# Patient Record
Sex: Male | Born: 1955
Health system: Southern US, Community
[De-identification: ages and names within clinical notes are randomized; demographics above are authoritative.]

## PROBLEM LIST (undated history)

## (undated) DIAGNOSIS — E7800 Pure hypercholesterolemia, unspecified: Secondary | ICD-10-CM

## (undated) DIAGNOSIS — Q211 Atrial septal defect: Secondary | ICD-10-CM

## (undated) DIAGNOSIS — I712 Thoracic aortic aneurysm, without rupture, unspecified: Secondary | ICD-10-CM

## (undated) DIAGNOSIS — M797 Fibromyalgia: Secondary | ICD-10-CM

## (undated) DIAGNOSIS — I639 Cerebral infarction, unspecified: Secondary | ICD-10-CM

## (undated) DIAGNOSIS — F32A Depression, unspecified: Secondary | ICD-10-CM

## (undated) DIAGNOSIS — G44009 Cluster headache syndrome, unspecified, not intractable: Secondary | ICD-10-CM

## (undated) DIAGNOSIS — I10 Essential (primary) hypertension: Secondary | ICD-10-CM

## (undated) DIAGNOSIS — F329 Major depressive disorder, single episode, unspecified: Secondary | ICD-10-CM

## (undated) HISTORY — DX: Thoracic aortic aneurysm, without rupture, unspecified: I71.20

## (undated) HISTORY — DX: Thoracic aortic aneurysm, without rupture: I71.2

---

## 1988-06-29 HISTORY — PX: SHOULDER OPEN ROTATOR CUFF REPAIR: SHX2407

## 2011-06-30 HISTORY — PX: KNEE ARTHROSCOPY: SHX127

## 2015-06-08 ENCOUNTER — Encounter (HOSPITAL_COMMUNITY): Payer: Self-pay | Admitting: Emergency Medicine

## 2015-06-08 ENCOUNTER — Inpatient Hospital Stay (HOSPITAL_COMMUNITY): Payer: BC Managed Care – PPO

## 2015-06-08 ENCOUNTER — Inpatient Hospital Stay (HOSPITAL_COMMUNITY)
Admission: EM | Admit: 2015-06-08 | Discharge: 2015-06-10 | DRG: 065 | Disposition: A | Discharge status: Home / Self Care | Payer: BC Managed Care – PPO | Attending: Internal Medicine | Admitting: Internal Medicine

## 2015-06-08 ENCOUNTER — Emergency Department (HOSPITAL_COMMUNITY): Payer: BC Managed Care – PPO

## 2015-06-08 ENCOUNTER — Observation Stay (HOSPITAL_COMMUNITY): Payer: BC Managed Care – PPO

## 2015-06-08 DIAGNOSIS — G459 Transient cerebral ischemic attack, unspecified: Secondary | ICD-10-CM | POA: Diagnosis not present

## 2015-06-08 DIAGNOSIS — I6350 Cerebral infarction due to unspecified occlusion or stenosis of unspecified cerebral artery: Secondary | ICD-10-CM | POA: Diagnosis not present

## 2015-06-08 DIAGNOSIS — Z79899 Other long term (current) drug therapy: Secondary | ICD-10-CM

## 2015-06-08 DIAGNOSIS — I639 Cerebral infarction, unspecified: Secondary | ICD-10-CM | POA: Diagnosis present

## 2015-06-08 DIAGNOSIS — R278 Other lack of coordination: Secondary | ICD-10-CM | POA: Diagnosis present

## 2015-06-08 DIAGNOSIS — E785 Hyperlipidemia, unspecified: Secondary | ICD-10-CM | POA: Diagnosis present

## 2015-06-08 DIAGNOSIS — G9389 Other specified disorders of brain: Secondary | ICD-10-CM | POA: Diagnosis present

## 2015-06-08 DIAGNOSIS — R200 Anesthesia of skin: Secondary | ICD-10-CM | POA: Diagnosis present

## 2015-06-08 DIAGNOSIS — R4701 Aphasia: Secondary | ICD-10-CM | POA: Diagnosis present

## 2015-06-08 DIAGNOSIS — I10 Essential (primary) hypertension: Secondary | ICD-10-CM | POA: Diagnosis present

## 2015-06-08 DIAGNOSIS — Z7982 Long term (current) use of aspirin: Secondary | ICD-10-CM

## 2015-06-08 DIAGNOSIS — I63412 Cerebral infarction due to embolism of left middle cerebral artery: Principal | ICD-10-CM | POA: Diagnosis present

## 2015-06-08 DIAGNOSIS — Q211 Atrial septal defect: Secondary | ICD-10-CM

## 2015-06-08 DIAGNOSIS — Z8673 Personal history of transient ischemic attack (TIA), and cerebral infarction without residual deficits: Secondary | ICD-10-CM

## 2015-06-08 HISTORY — DX: Essential (primary) hypertension: I10

## 2015-06-08 HISTORY — DX: Cerebral infarction, unspecified: I63.9

## 2015-06-08 LAB — DIFFERENTIAL
Basophils Absolute: 0 10*3/uL (ref 0.0–0.1)
Basophils Relative: 1 %
Eosinophils Absolute: 0.1 10*3/uL (ref 0.0–0.7)
Eosinophils Relative: 2 %
Lymphocytes Relative: 22 %
Lymphs Abs: 1.2 10*3/uL (ref 0.7–4.0)
Monocytes Absolute: 0.7 10*3/uL (ref 0.1–1.0)
Monocytes Relative: 12 %
Neutro Abs: 3.5 10*3/uL (ref 1.7–7.7)
Neutrophils Relative %: 63 %

## 2015-06-08 LAB — COMPREHENSIVE METABOLIC PANEL
ALT: 42 U/L (ref 17–63)
AST: 56 U/L — ABNORMAL HIGH (ref 15–41)
Albumin: 3.8 g/dL (ref 3.5–5.0)
Alkaline Phosphatase: 34 U/L — ABNORMAL LOW (ref 38–126)
Anion gap: 8 (ref 5–15)
BUN: 14 mg/dL (ref 6–20)
CO2: 25 mmol/L (ref 22–32)
Calcium: 9.1 mg/dL (ref 8.9–10.3)
Chloride: 104 mmol/L (ref 101–111)
Creatinine, Ser: 1.03 mg/dL (ref 0.61–1.24)
GFR calc Af Amer: >60 mL/min (ref >60)
GFR calc non Af Amer: >60 mL/min (ref >60)
Glucose, Bld: 126 mg/dL — ABNORMAL HIGH (ref 65–99)
Potassium: 3.9 mmol/L (ref 3.5–5.1)
Sodium: 137 mmol/L (ref 135–145)
Total Bilirubin: 0.8 mg/dL (ref 0.3–1.2)
Total Protein: 6.5 g/dL (ref 6.5–8.1)

## 2015-06-08 LAB — CBC
HCT: 41.8 % (ref 39.0–52.0)
Hemoglobin: 14.1 g/dL (ref 13.0–17.0)
MCH: 28.5 pg (ref 26.0–34.0)
MCHC: 33.7 g/dL (ref 30.0–36.0)
MCV: 84.4 fL (ref 78.0–100.0)
Platelets: 166 10*3/uL (ref 150–400)
RBC: 4.95 MIL/uL (ref 4.22–5.81)
RDW: 13.5 % (ref 11.5–15.5)
WBC: 5.6 10*3/uL (ref 4.0–10.5)

## 2015-06-08 LAB — URINALYSIS, ROUTINE W REFLEX MICROSCOPIC
Bilirubin Urine: NEGATIVE
Glucose, UA: NEGATIVE mg/dL
Hgb urine dipstick: NEGATIVE
Ketones, ur: NEGATIVE mg/dL
Leukocytes, UA: NEGATIVE
Nitrite: NEGATIVE
Protein, ur: NEGATIVE mg/dL
Specific Gravity, Urine: 1.016 (ref 1.005–1.030)
pH: 6.5 (ref 5.0–8.0)

## 2015-06-08 LAB — PROTIME-INR
INR: 1.06 (ref 0.00–1.49)
Prothrombin Time: 14 seconds (ref 11.6–15.2)

## 2015-06-08 LAB — I-STAT CHEM 8, ED
BUN: 16 mg/dL (ref 6–20)
Calcium, Ion: 1.17 mmol/L (ref 1.13–1.30)
Chloride: 102 mmol/L (ref 101–111)
Creatinine, Ser: 0.8 mg/dL (ref 0.61–1.24)
Glucose, Bld: 119 mg/dL — ABNORMAL HIGH (ref 65–99)
HCT: 45 % (ref 39.0–52.0)
Hemoglobin: 15.3 g/dL (ref 13.0–17.0)
Potassium: 3.8 mmol/L (ref 3.5–5.1)
Sodium: 140 mmol/L (ref 135–145)
TCO2: 27 mmol/L (ref 0–100)

## 2015-06-08 LAB — ETHANOL: Alcohol, Ethyl (B): <5 mg/dL (ref <5)

## 2015-06-08 LAB — RAPID URINE DRUG SCREEN, HOSP PERFORMED
Amphetamines: NOT DETECTED
Barbiturates: NOT DETECTED
Benzodiazepines: NOT DETECTED
Cocaine: NOT DETECTED
Opiates: NOT DETECTED
Tetrahydrocannabinol: NOT DETECTED

## 2015-06-08 LAB — LIPID PANEL
Cholesterol: 138 mg/dL (ref 0–200)
HDL: 60 mg/dL (ref >40)
LDL Cholesterol: 68 mg/dL (ref 0–99)
Total CHOL/HDL Ratio: 2.3 RATIO
Triglycerides: 52 mg/dL (ref <150)
VLDL: 10 mg/dL (ref 0–40)

## 2015-06-08 LAB — I-STAT TROPONIN, ED: Troponin i, poc: 0 ng/mL (ref 0.00–0.08)

## 2015-06-08 LAB — APTT: aPTT: 28 seconds (ref 24–37)

## 2015-06-08 LAB — TROPONIN I: Troponin I: <0.03 ng/mL (ref <0.031)

## 2015-06-08 MED ORDER — STROKE: EARLY STAGES OF RECOVERY BOOK
Freq: Once | Status: AC
  Administered 2015-06-08: 18:00:00
  Filled 2015-06-08: qty 1

## 2015-06-08 MED ORDER — LORAZEPAM 2 MG/ML IJ SOLN
0.5000 mg | Freq: Once | INTRAMUSCULAR | Status: AC
  Administered 2015-06-08: 0.5 mg via INTRAVENOUS
  Filled 2015-06-08: qty 1

## 2015-06-08 MED ORDER — ASPIRIN 325 MG PO TABS
325.0000 mg | ORAL_TABLET | Freq: Every day | ORAL | Status: DC
  Administered 2015-06-09: 325 mg via ORAL
  Filled 2015-06-08 (×2): qty 1

## 2015-06-08 MED ORDER — PAROXETINE HCL 20 MG PO TABS
10.0000 mg | ORAL_TABLET | Freq: Every day | ORAL | Status: DC
  Administered 2015-06-09: 10 mg via ORAL
  Filled 2015-06-08 (×2): qty 1

## 2015-06-08 MED ORDER — DICLOFENAC SODIUM 75 MG PO TBEC
75.0000 mg | DELAYED_RELEASE_TABLET | Freq: Two times a day (BID) | ORAL | Status: DC
  Filled 2015-06-08: qty 1

## 2015-06-08 MED ORDER — ATORVASTATIN CALCIUM 10 MG PO TABS
20.0000 mg | ORAL_TABLET | Freq: Every day | ORAL | Status: DC
  Administered 2015-06-09: 20 mg via ORAL
  Filled 2015-06-08 (×2): qty 2

## 2015-06-08 MED ORDER — SENNOSIDES-DOCUSATE SODIUM 8.6-50 MG PO TABS: 1.0000 | ORAL_TABLET | Freq: Every evening | ORAL | Status: DC | PRN

## 2015-06-08 MED ORDER — ENOXAPARIN SODIUM 30 MG/0.3ML ~~LOC~~ SOLN
30.0000 mg | SUBCUTANEOUS | Status: DC
  Administered 2015-06-08 – 2015-06-09 (×2): 30 mg via SUBCUTANEOUS
  Filled 2015-06-08 (×2): qty 0.3

## 2015-06-08 MED ORDER — SODIUM CHLORIDE 0.9 % IV SOLN
INTRAVENOUS | Status: DC
  Administered 2015-06-08: 1000 mL via INTRAVENOUS
  Administered 2015-06-09: 06:00:00 via INTRAVENOUS

## 2015-06-08 NOTE — ED Provider Notes (Signed)
CSN: [id number]     Arrival date & time 06/08/15  1150 History   First MD Initiated Contact with Patient 06/08/15 1158     Chief Complaint  Patient presents with  . Aphasia  . Numbness    An emergency department physician performed an initial assessment on this suspected stroke patient at 1152. (Consider location/radiation/quality/duration/timing/severity/associated sxs/prior Treatment) HPI There is a 59 year old man who presents today with complaint of left upper extremity numbness and difficulty speaking. He states this occurred about 11 PM. Symptoms have improved. He was alone but states that he was normal prior to this happening. He describes having some difficulty using his left hand at the time. He has had a previous stroke but was in a different system at that time. He describes having complete resolution of his symptoms since then. He has been taking his medications as advised. He denies any head injury or headache. He denies any chest pain, cough, abdominal pain, vomiting, or diarrhea. Past Medical History  Diagnosis Date  . Stroke [facility](HCC)     Bleed in June 2016  . Hypertension    Past Surgical History  Procedure Laterality Date  . Shoulder surgery Right   . Knee surgery Right    History reviewed. No pertinent family history. Social History  Substance Use Topics  . Smoking status: Never Smoker   . Smokeless tobacco: None  . Alcohol Use: 1.2 oz/week    2 Glasses of wine per week    Review of Systems  All other systems reviewed and are negative.     Allergies  Review of patient's allergies indicates no known allergies.  Home Medications   Prior to Admission medications   Not on File   BP 135/91 mmHg  Pulse 62  Temp(Src) 98.1 F (36.7 C)  Resp 12  Ht 6' (1.829 m)  Wt 90.719 kg  BMI 27.12 kg/m2  SpO2 98% Physical Exam  Constitutional: He is oriented to person, place, and time. He appears well-developed and well-nourished.  HENT:  Head: Normocephalic and  atraumatic.  Right Ear: Tympanic membrane and external ear normal.  Left Ear: Tympanic membrane and external ear normal.  Nose: Nose normal. Right sinus exhibits no maxillary sinus tenderness and no frontal sinus tenderness. Left sinus exhibits no maxillary sinus tenderness and no frontal sinus tenderness.  Mouth/Throat: Oropharynx is clear and moist.  Eyes: Conjunctivae and EOM are normal. Pupils are equal, round, and reactive to light. Right eye exhibits no nystagmus. Left eye exhibits no nystagmus.  Neck: Normal range of motion. Neck supple.  Cardiovascular: Normal rate, regular rhythm, normal heart sounds and intact distal pulses.   Pulmonary/Chest: Effort normal and breath sounds normal. No respiratory distress. He exhibits no tenderness.  Abdominal: Soft. Bowel sounds are normal. He exhibits no distension and no mass. There is no tenderness.  Musculoskeletal: Normal range of motion. He exhibits no edema or tenderness.  Neurological: He is alert and oriented to person, place, and time. He has normal strength and normal reflexes. No sensory deficit. He displays a negative Romberg sign. GCS eye subscore is 4. GCS verbal subscore is 5. GCS motor subscore is 6.  Reflex Scores:      Tricep reflexes are 2+ on the right side and 2+ on the left side.      Bicep reflexes are 2+ on the right side and 2+ on the left side.      Brachioradialis reflexes are 2+ on the right side and 2+ on the left side.  Patellar reflexes are 2+ on the right side and 2+ on the left side.      Achilles reflexes are 2+ on the right side and 2+ on the left side. Speech is normal without dysarthria, dysphasia, or aphasia. Muscle strength is 5/5 in bilateral shoulders, elbow flexor and extensors, wrist flexor and extensors, and intrinsic hand muscles. 5/5 bilateral lower extremity hip flexors, extensors, knee flexors and extensors, and ankle dorsi and plantar flexors.    Skin: Skin is warm and dry. No rash noted.   Psychiatric: He has a normal mood and affect. His behavior is normal. Judgment and thought content normal.  Nursing note and vitals reviewed.   ED Course  Procedures (including critical care time) Labs Review Labs Reviewed  I-STAT CHEM 8, ED - Abnormal; Notable for the following:    Glucose, Bld 119 (*)    All other components within normal limits  PROTIME-INR  APTT  CBC  DIFFERENTIAL  ETHANOL  COMPREHENSIVE METABOLIC PANEL  URINE RAPID DRUG SCREEN, HOSP PERFORMED  URINALYSIS, ROUTINE W REFLEX MICROSCOPIC (NOT AT [facility])  I-STAT TROPOININ, ED    Imaging Review Ct Head Wo Contrast  06/08/2015  CLINICAL DATA:  59 year old presenting with acute onset of left-sided weakness approximately 1 hour 15 min prior to imaging. Personal history of stroke in June, 2016. EXAM: CT HEAD WITHOUT CONTRAST TECHNIQUE: Contiguous axial images were obtained from the base of the skull through the vertex without intravenous contrast. COMPARISON:  No prior imaging is available for comparison. FINDINGS: Ventricular system normal in size and appearance for age. Encephalomalacia involving the white matter at the corticomedullary junction of the left frontal lobe (images 22, 23). No mass lesion. No midline shift. No acute hemorrhage or hematoma. No extra-axial fluid collections. No evidence of acute infarction. No skull fracture or other focal osseous abnormality involving the skull. Mild mucosal thickening involving anterior ethmoid air cells bilaterally and the base of the right maxillary sinus. Small mucous retention cyst or polyp in the right sphenoid sinus. Bilateral mastoid air cells and middle ear cavities well aerated. IMPRESSION: 1. No acute intracranial abnormality. 2. Focal encephalomalacia involving the white matter at the corticomedullary junction of the left frontal lobe, either the sequela of prior intracranial hemorrhage or an old white matter stroke. I telephoned these Code Stroke results at the time of  interpretation on 06/08/2015 at 12:22 pm to Dr. Amada Jupiter of the neuro hospitalist service, who verbally acknowledged these results. Electronically Signed   By: Hulan Saas M.D.   On: 06/08/2015 12:26   I have personally reviewed and evaluated these images and lab results as part of my medical decision-making.   EKG Interpretation   Date/Time:  Saturday June 08 2015 12:00:59 EST Ventricular Rate:  67 PR Interval:  218 QRS Duration: 91 QT Interval:  376 QTC Calculation: 397 R Axis:   68 Text Interpretation:  Normal sinus rhythm Premature ventricular complexes  Confirmed by Nyashia Raney MD, Duwayne Heck ([id number]) on 06/08/2015 12:05:29 PM      MDM   Final diagnoses:  Transient cerebral ischemia, unspecified transient cerebral ischemia type   59 year old male with history of stroke presents today with left-sided hand weakness and difficulty speaking. Symptoms have resolved here in the emergency department. Head CT reveals no evidence of acute abnormality. Dr. Amada Jupiter is on call for stroke and also saw and evaluated the patient. The plan is to have the patient admitted and evaluated for TIA.   Margarita Grizzle, MD 06/08/15 [phone]

## 2015-06-08 NOTE — Progress Notes (Signed)
Patient arrived from ED around 1630 alert and oriented NIH of 0 no pain will continue to monitor.

## 2015-06-08 NOTE — ED Notes (Signed)
Patient transported to X-ray 

## 2015-06-08 NOTE — ED Notes (Signed)
Patient transported to CT 

## 2015-06-08 NOTE — H&P (Signed)
[facility] History and Physical  Troy [name] ZOX:[id number] DOB: [birth year] DOA: 06/08/2015  Referring physician: ray PCP: No PCP Per Patient   Chief Complaint: left hand/arm numbness/aphasia  HPI: Troy [name] is a 59 y.o. male with a past medical history that includes stroke in June 2016, attention presents to the emergency department with the chief complaint of expressive aphasia and left arm weakness and numbness. He presented to the emergency department code stroke was called and he was evaluated by Dr. Amada Jupiter with neurology who opined TIA not TPA candidate due to resolution of symptoms were recommended admission for TIA workup.  She reports getting up this morning in the process of brushes teeth and come in his hair he developed left arm and left hand numbness. So sedated symptoms include word searching and expressive aphasia while trying to call EMS. He reports taking 325 mg of aspirin and waiting for the ambulance. He reports symptoms resolved while in the ambulance ride to the hospital. He denies any chest pain palpitations headache dizziness visual disturbances syncope or near-syncope. He denies any abdominal pain nausea vomiting diarrhea. He denies any recent illness fever chills or decreased oral intake.  The emergency department he is afebrile hemodynamically stable and not hypoxic.    Review of Systems:  10 point review of systems complete and all systems are negative except as indicated in the history of present illness  Past Medical History  Diagnosis Date  . Stroke [facility])     Bleed in June 2016  . Hypertension   . TIA (transient ischemic attack)    Past Surgical History  Procedure Laterality Date  . Shoulder surgery Right   . Knee surgery Right    Social History:  reports that he has never smoked. He does not have any smokeless tobacco history on file. He reports that he drinks about 1.2 oz of alcohol per week. He reports that he does not use illicit  drugs. Is married he lives at home with his wife and children who reside in [place] as she is a Runner, broadcasting/film/video up there and he is a Paramedic at [employer]. He is independent with ADLs No Known Allergies  History reviewed. No pertinent family history. he has 3 siblings their collective medical history is positive for hypertension  Prior to Admission medications   Medication Sig Start Date End Date Taking? Authorizing Provider  aspirin 325 MG tablet Take 325 mg by mouth daily.   Yes Historical Provider, MD  atorvastatin (LIPITOR) 20 MG tablet Take 1 tablet by mouth daily. 04/04/15  Yes Historical Provider, MD  diclofenac (VOLTAREN) 75 MG EC tablet Take 1 tablet by mouth 2 (two) times daily. 05/08/15  Yes Historical Provider, MD  PARoxetine (PAXIL) 10 MG tablet Take 1 tablet by mouth daily. 04/04/15  Yes Historical Provider, MD  triamterene-hydrochlorothiazide (MAXZIDE-25) 37.5-25 MG tablet Take 1 tablet by mouth daily. 04/15/15  Yes Historical Provider, MD   Physical Exam: Filed Vitals:   06/08/15 1330 06/08/15 1345 06/08/15 1400 06/08/15 1415  BP: 131/79 115/104 134/98 145/96  Pulse: 56 62 60 86  Temp:      TempSrc:      Resp: Height:      Weight:      SpO2: 95% 96% 94% 95%    Wt Readings from Last 3 Encounters:  06/08/15 90.719 kg (200 lb)    General:  Appears calm and comfortable Eyes: PERRL, normal lids, irises & conjunctiva ENT: grossly normal hearing,  lips & tongue Neck: no LAD, masses or thyromegaly Cardiovascular: RRR, no m/r/g. No LE edema. Telemetry: SR, no arrhythmias  Respiratory: CTA bilaterally, no w/r/r. Normal respiratory effort. Abdomen: soft, ntnd other bowel sounds throughout no guarding or rebounding Skin: no rash or induration seen on limited exam Musculoskeletal: grossly normal tone BUE/BLE Psychiatric: grossly normal mood and affect, speech fluent and appropriate Neurologic: grossly non-focal. Speech clear facial symmetry tongue midline no  pronator drift           Labs on Admission:  Basic Metabolic Panel:  Recent Labs Lab 06/08/15 1200 06/08/15 1208  NA 137 140  K 3.9 3.8  CL 104 102  CO2 25  --   GLUCOSE 126* 119*  BUN 14 16  CREATININE 1.03 0.80  CALCIUM 9.1  --    Liver Function Tests:  Recent Labs Lab 06/08/15 1200  AST 56*  ALT 42  ALKPHOS 34*  BILITOT 0.8  PROT 6.5  ALBUMIN 3.8   No results for input(s): LIPASE, AMYLASE in the last 168 hours. No results for input(s): AMMONIA in the last 168 hours. CBC:  Recent Labs Lab 06/08/15 1200 06/08/15 1208  WBC 5.6  --   NEUTROABS 3.5  --   HGB 14.1 15.3  HCT 41.8 45.0  MCV 84.4  --   PLT 166  --    Cardiac Enzymes: No results for input(s): CKTOTAL, CKMB, CKMBINDEX, TROPONINI in the last 168 hours.  BNP (last 3 results) No results for input(s): BNP in the last 8760 hours.  ProBNP (last 3 results) No results for input(s): PROBNP in the last 8760 hours.  CBG: No results for input(s): GLUCAP in the last 168 hours.  Radiological Exams on Admission: Ct Head Wo Contrast  06/08/2015  CLINICAL DATA:  59 year old presenting with acute onset of left-sided weakness approximately 1 hour 15 min prior to imaging. Personal history of stroke in June, 2016. EXAM: CT HEAD WITHOUT CONTRAST TECHNIQUE: Contiguous axial images were obtained from the base of the skull through the vertex without intravenous contrast. COMPARISON:  No prior imaging is available for comparison. FINDINGS: Ventricular system normal in size and appearance for age. Encephalomalacia involving the white matter at the corticomedullary junction of the left frontal lobe (images 22, 23). No mass lesion. No midline shift. No acute hemorrhage or hematoma. No extra-axial fluid collections. No evidence of acute infarction. No skull fracture or other focal osseous abnormality involving the skull. Mild mucosal thickening involving anterior ethmoid air cells bilaterally and the base of the right  maxillary sinus. Small mucous retention cyst or polyp in the right sphenoid sinus. Bilateral mastoid air cells and middle ear cavities well aerated. IMPRESSION: 1. No acute intracranial abnormality. 2. Focal encephalomalacia involving the white matter at the corticomedullary junction of the left frontal lobe, either the sequela of prior intracranial hemorrhage or an old white matter stroke. I telephoned these Code Stroke results at the time of interpretation on 06/08/2015 at 12:22 pm to Dr. Amada Jupiter of the neuro hospitalist service, who verbally acknowledged these results. Electronically Signed   By: Hulan Saas M.D.   On: 06/08/2015 12:26    EKG: Independently reviewed. Will sinus rhythm with PVCs  Assessment/Plan Active Problems:   TIA (transient ischemic attack)   Hypertension   Stroke (cerebrum) (HCC)   Aphasia   Left arm numbness  #1. Left arm numbness/aphasia/TIA. CT of head with focal encephalomalacia involving the white matter at the corticomedullary junction of the left frontal lobe, either the sequela of  prior intracranial hemorrhage or an old white matter stroke. Patient with history of stroke in June 2016 in [place] awaiting records -Symptoms resolved in route to hospital -Will admit for TIA workup -Will obtain MRI/MRA carotid Dopplers 2-D echo -We'll keep him nothing by mouth until he passes bedside swallow eval and then will give heart healthy diet -Continue his home aspirin and statin -Obtain a lipid panel hemoglobin A1c -He was evaluated by Dr. Amada JupiterKirkpatrick was neuro as code stroke was called and then canceled. Neurology opined non-TPA candidate as symptoms resolve -Physical therapy -await neuro recommendations   #2. Hypertension. controlled in the emergency department -Home medications include Maxzide 25/37.5 I will hold this for now to allow for permissive hypertension -Pressure in the emergency department 145/96 -Monitor closely    Code Status: full DVT  Prophylaxis: Family Communication: none present Disposition Plan: home hopefully tomorrow  Time spent: 50  minutes  [facility] HospitalBLACK,Harjit Douds M [facility]

## 2015-06-08 NOTE — ED Notes (Signed)
Reports at 1100 today while brushing teeth left arm and hand felt numb; has history of stroke, so called EMS. He felt he had trouble getting words out while making that call. He took 325mg  ASA at home. All resolved in route except he states his left hand still feels numb. Dr. Rosalia Hammersay at bedside to eval.

## 2015-06-08 NOTE — ED Notes (Signed)
Dr. Rosalia Hammersay at bedside evaluating for code stroke.

## 2015-06-08 NOTE — ED Notes (Signed)
Patient returned from MRI.

## 2015-06-08 NOTE — ED Notes (Signed)
Attempted report 

## 2015-06-08 NOTE — ED Notes (Signed)
Patient transported to MRI 

## 2015-06-08 NOTE — Consult Note (Signed)
Neurology Consultation Reason for Consult: Left-sided numbness Referring Physician: Rosalia Hammersay, D  CC: Left-sided numbness  History is obtained from: Patient  HPI: Janice CoffinRobert Martelle is a 59 y.o. male with a history of previous stroke (felt to be ischemic with hemorrhagic conversion) in 2010 who presents with sudden onset left-sided numbness started earlier today. He states that his arms completely resolved with the exception of mild difficulty with moving his left thumb.  LKW: 11 AM tpa given?: no, rapidly improving mild symptoms    ROS: A 14 point ROS was performed and is negative except as noted in the HPI.   Past Medical History  Diagnosis Date  . Stroke [facility](HCC)     Bleed in June 2016  . Hypertension   . TIA (transient ischemic attack)      History reviewed. No pertinent family history.   Social History:  reports that he has never smoked. He does not have any smokeless tobacco history on file. He reports that he drinks about 1.2 oz of alcohol per week. He reports that he does not use illicit drugs.   Exam: Current vital signs: BP 128/75 mmHg  Pulse 62  Temp(Src) 98.1 F (36.7 C)  Resp 20  Ht 6' (1.829 m)  Wt 90.719 kg (200 lb)  BMI 27.12 kg/m2  SpO2 96% Vital signs in last 24 hours: Temp:  [98.1 F (36.7 C)] 98.1 F (36.7 C) (12/10 1215) Pulse Rate:  [56-86] 62 (12/10 1548) Resp:  [12-20] 20 (12/10 1548) BP: (115-157)/(75-104) 128/75 mmHg (12/10 1548) SpO2:  [94 %-98 %] 96 % (12/10 1548) Weight:  [90.719 kg (200 lb)] 90.719 kg (200 lb) (12/10 1214)   Physical Exam  Constitutional: Appears well-developed and well-nourished.  Psych: Affect appropriate to situation Eyes: No scleral injection HENT: No OP obstrucion Head: Normocephalic.  Cardiovascular: Normal rate and regular rhythm.  Respiratory: Effort normal and breath sounds normal to anterior ascultation GI: Soft.  No distension. There is no tenderness.  Skin: WDI  Neuro: Mental Status: Patient is awake,  alert, oriented to person, place, month, year, and situation. Patient is able to give a clear and coherent history. No signs of aphasia or neglect Cranial Nerves: II: Visual Fields are full. Pupils are equal, round, and reactive to light.   III,IV, VI: EOMI without ptosis or diploplia.  V: Facial sensation is symmetric to temperature VII: Facial movement is symmetric.  VIII: hearing is intact to voice X: Uvula elevates symmetrically XI: Shoulder shrug is symmetric. XII: tongue is midline without atrophy or fasciculations.  Motor: Tone is normal. Bulk is normal. 5/5 strength was present in all four extremities.  Sensory: Sensation is symmetric to light touch and temperature in the arms and legs. Deep Tendon Reflexes: 2+ and symmetric in the biceps and patellae.  Plantars: Toes are downgoing bilaterally.  Cerebellar: FNF and HKS are intact bilaterally     I have reviewed labs in epic and the results pertinent to this consultation are: CMP-unremarkable  I have reviewed the images obtained: MRI brain- scattered infarcts in the right MCA territory  Impression: 59 year old male with what I suspect was likely an embolus which broke up. He will need further evaluation for secondary stroke prevention.  Recommendations: 1. HgbA1c, fasting lipid panel 2. Frequent neuro checks 3. Echocardiogram 4. Carotid dopplers 5. Prophylactic therapy-Antiplatelet med: Aspirin - dose 325mg  PO or 300mg  PR 6. Risk factor modification 7. Telemetry monitoring 8. PT consult, OT consult, Speech consult   Ritta SlotMcNeill Zakhai Meisinger, MD [facility] [phone]  If  7pm- 7am, please page neurology on call as listed in [place].

## 2015-06-08 NOTE — ED Notes (Signed)
Dr. Amada JupiterKirkpatrick at bedside doing evaluation.

## 2015-06-08 NOTE — ED Notes (Signed)
Patient returned from CT

## 2015-06-09 ENCOUNTER — Observation Stay (HOSPITAL_COMMUNITY): Payer: BC Managed Care – PPO

## 2015-06-09 DIAGNOSIS — I639 Cerebral infarction, unspecified: Secondary | ICD-10-CM | POA: Diagnosis not present

## 2015-06-09 DIAGNOSIS — R278 Other lack of coordination: Secondary | ICD-10-CM | POA: Diagnosis present

## 2015-06-09 DIAGNOSIS — R4701 Aphasia: Secondary | ICD-10-CM | POA: Diagnosis present

## 2015-06-09 DIAGNOSIS — I63411 Cerebral infarction due to embolism of right middle cerebral artery: Secondary | ICD-10-CM

## 2015-06-09 DIAGNOSIS — G459 Transient cerebral ischemic attack, unspecified: Secondary | ICD-10-CM

## 2015-06-09 DIAGNOSIS — Z8673 Personal history of transient ischemic attack (TIA), and cerebral infarction without residual deficits: Secondary | ICD-10-CM | POA: Diagnosis not present

## 2015-06-09 DIAGNOSIS — I638 Other cerebral infarction: Secondary | ICD-10-CM

## 2015-06-09 DIAGNOSIS — E785 Hyperlipidemia, unspecified: Secondary | ICD-10-CM | POA: Diagnosis present

## 2015-06-09 DIAGNOSIS — Q211 Atrial septal defect: Secondary | ICD-10-CM | POA: Diagnosis not present

## 2015-06-09 DIAGNOSIS — G9389 Other specified disorders of brain: Secondary | ICD-10-CM | POA: Diagnosis present

## 2015-06-09 DIAGNOSIS — R208 Other disturbances of skin sensation: Secondary | ICD-10-CM | POA: Diagnosis not present

## 2015-06-09 DIAGNOSIS — R200 Anesthesia of skin: Secondary | ICD-10-CM | POA: Diagnosis present

## 2015-06-09 DIAGNOSIS — I10 Essential (primary) hypertension: Secondary | ICD-10-CM

## 2015-06-09 DIAGNOSIS — I63412 Cerebral infarction due to embolism of left middle cerebral artery: Secondary | ICD-10-CM | POA: Diagnosis present

## 2015-06-09 DIAGNOSIS — Z7982 Long term (current) use of aspirin: Secondary | ICD-10-CM | POA: Diagnosis not present

## 2015-06-09 DIAGNOSIS — Z79899 Other long term (current) drug therapy: Secondary | ICD-10-CM | POA: Diagnosis not present

## 2015-06-09 LAB — BASIC METABOLIC PANEL
Anion gap: 5 (ref 5–15)
BUN: 10 mg/dL (ref 6–20)
CO2: 29 mmol/L (ref 22–32)
Calcium: 8.8 mg/dL — ABNORMAL LOW (ref 8.9–10.3)
Chloride: 106 mmol/L (ref 101–111)
Creatinine, Ser: 0.99 mg/dL (ref 0.61–1.24)
GFR calc Af Amer: >60 mL/min (ref >60)
GFR calc non Af Amer: >60 mL/min (ref >60)
Glucose, Bld: 100 mg/dL — ABNORMAL HIGH (ref 65–99)
Potassium: 4.2 mmol/L (ref 3.5–5.1)
Sodium: 140 mmol/L (ref 135–145)

## 2015-06-09 LAB — TROPONIN I: Troponin I: <0.03 ng/mL (ref <0.031)

## 2015-06-09 NOTE — Progress Notes (Signed)
[facility] that [facility] Department is not opened till Monday. Will pass on to oncoming nurse to follow-up.

## 2015-06-09 NOTE — Progress Notes (Signed)
  Echocardiogram 2D Echocardiogram has been performed.  Troy SavoyCasey N Sterling [name] 06/09/2015, 3:46 PM

## 2015-06-09 NOTE — Progress Notes (Signed)
OT Cancellation Note  Patient Details Name: Janice CoffinRobert Radermacher MRN: [id number] DOB: 1955-11-09   Cancelled Treatment:    Reason Eval/Treat Not Completed: OT screened, no needs identified, will sign off. Pt reports he feels he is back to his baseline. He states he has no concerns with bathing, dressing, etc. He also reports his L hand/thumb function has returned to baseline. Will sign off.  Lennox LaityStone, Kenyata Napier Stafford  [phone] 06/09/2015, 1:03 PM

## 2015-06-09 NOTE — Progress Notes (Signed)
I picked patient up from out going nurse around 1530 he is oriented, alert, no pain NIH is 0 and he is signed off independent, will continue to monitor.

## 2015-06-09 NOTE — Evaluation (Signed)
Physical Therapy Evaluation Patient Details Name: Troy CoffinRobert Felkins MRN: [id number] DOB: [birth year] Today's Date: 06/09/2015   History of Present Illness  Troy CoffinRobert Mckethan is a 59 y.o. male with a history of previous stroke (felt to be ischemic with hemorrhagic conversion) in 2010 who presents with sudden onset left-sided numbness started earlier today. He states that his arms completely resolved with the exception of mild difficulty with moving his left thumb.  Clinical Impression  Patient evaluated by Physical Therapy with no further acute PT needs identified. All education has been completed and the patient has no further questions. Pt's symptoms appear to have completely resolved, no strength or balance deficits noted on eval, gait pattern WFL.  See below for any follow-up Physical Therapy or equipment needs. PT is signing off. Thank you for this referral.     Follow Up Recommendations No PT follow up    Equipment Recommendations  None recommended by PT    Recommendations for Other Services       Precautions / Restrictions Precautions Precautions: None Restrictions Weight Bearing Restrictions: No      Mobility  Bed Mobility Overal bed mobility: Independent                Transfers Overall transfer level: Independent Equipment used: None                Ambulation/Gait Ambulation/Gait assistance: Independent Ambulation Distance (Feet): 300 Feet Assistive device: None Gait Pattern/deviations: WFL(Within Functional Limits) Gait velocity: WFL Gait velocity interpretation: at or above normal speed for age/gender General Gait Details: no difficulties with ambulation, normal pace and pattern  Stairs Stairs: Yes Stairs assistance: Independent Stair Management: No rails;Alternating pattern;Forwards Number of Stairs: 10    Wheelchair Mobility    Modified Rankin (Stroke Patients Only) Modified Rankin (Stroke Patients Only) Pre-Morbid Rankin Score: No  symptoms Modified Rankin: No symptoms     Balance Overall balance assessment: Independent                               Standardized Balance Assessment Standardized Balance Assessment : Berg Balance Test Berg Balance Test Sit to Stand: Able to stand without using hands and stabilize independently Standing Unsupported: Able to stand safely 2 minutes Sitting with Back Unsupported but Feet Supported on Floor or Stool: Able to sit safely and securely 2 minutes Stand to Sit: Sits safely with minimal use of hands Transfers: Able to transfer safely, minor use of hands Standing Unsupported with Eyes Closed: Able to stand 10 seconds safely Standing Ubsupported with Feet Together: Able to place feet together independently and stand 1 minute safely From Standing, Reach Forward with Outstretched Arm: Can reach confidently >25 cm (10") From Standing Position, Pick up Object from Floor: Able to pick up shoe safely and easily From Standing Position, Turn to Look Behind Over each Shoulder: Looks behind from both sides and weight shifts well Turn 360 Degrees: Able to turn 360 degrees safely in 4 seconds or less Standing Unsupported, Alternately Place Feet on Step/Stool: Able to stand independently and safely and complete 8 steps in 20 seconds Standing Unsupported, One Foot in Front: Able to place foot tandem independently and hold 30 seconds Standing on One Leg: Able to lift leg independently and hold > 10 seconds Total Score: 56         Pertinent Vitals/Pain Pain Assessment: No/denies pain    Home Living Family/patient expects to be discharged to:: Private residence Living Arrangements:  Alone Available Help at Discharge: Family;Available 24 hours/day (for 3 weeks) Type of Home: House         Home Equipment: None Additional Comments: pt moved down here from [place] for job at [employer], wife still lives in [place] and they visit back and forth. He will be up there with family for next 3 weeks  but then will be back down here alone    Prior Function Level of Independence: Independent         Comments: head of anthropology at [employer]     Hand Dominance        Extremity/Trunk Assessment   Upper Extremity Assessment: Overall WFL for tasks assessed (thumb no longer numb or weak)           Lower Extremity Assessment: Overall WFL for tasks assessed      Cervical / Trunk Assessment: Normal  Communication   Communication: No difficulties  Cognition Arousal/Alertness: Awake/alert Behavior During Therapy: WFL for tasks assessed/performed Overall Cognitive Status: Within Functional Limits for tasks assessed                      General Comments General comments (skin integrity, edema, etc.): symptoms have resolved completely    Exercises        Assessment/Plan    PT Assessment Patent does not need any further PT services  PT Diagnosis     PT Problem List    PT Treatment Interventions     PT Goals (Current goals can be found in the Care Plan section) Acute Rehab PT Goals Patient Stated Goal: return to home and work PT Goal Formulation: All assessment and education complete, DC therapy    Frequency     Barriers to discharge        Co-evaluation               End of Session   Activity Tolerance: Patient tolerated treatment well Patient left: in bed;with call bell/phone within reach Nurse Communication: Mobility status    Functional Assessment Tool Used: clinical judgement Functional Limitation: Mobility: Walking and moving around Mobility: Walking and Moving Around Current Status (Z6109): 0 percent impaired, limited or restricted Mobility: Walking and Moving Around Goal Status (U0454): 0 percent impaired, limited or restricted Mobility: Walking and Moving Around Discharge Status [phone]): 0 percent impaired, limited or restricted    Time: 9147-8295 PT Time Calculation (min) (ACUTE ONLY): 13 min   Charges:   PT Evaluation $Initial  PT Evaluation Tier I: 1 Procedure     PT G Codes:   PT G-Codes **NOT FOR INPATIENT CLASS** Functional Assessment Tool Used: clinical judgement Functional Limitation: Mobility: Walking and moving around Mobility: Walking and Moving Around Current Status (A2130): 0 percent impaired, limited or restricted Mobility: Walking and Moving Around Goal Status (Q6578): 0 percent impaired, limited or restricted Mobility: Walking and Moving Around Discharge Status [phone]): 0 percent impaired, limited or restricted   Lyanne Co, PT  Acute Rehab Services  [phone]  Lyanne Co 06/09/2015, 12:19 PM

## 2015-06-09 NOTE — Progress Notes (Signed)
STROKE TEAM PROGRESS NOTE   HISTORY Troy [name] is a 59 y.o. male with a history of previous stroke (felt to be ischemic with hemorrhagic conversion) in 2010 who presents with sudden onset left-sided numbness started earlier today. He states that his arms completely resolved with the exception of mild difficulty with moving his left thumb.  LKW: 11 AM tpa given?: no, rapidly improving mild symptoms   SUBJECTIVE (INTERVAL HISTORY) His family was not at the bedside.  Primary team at the bedside for discussion and planning.  Overall he feels his condition is completely resolved. He detailed for me the brief challenges he with speech at the onset of this event.  He is right handed and it is difficult to determine if he experienced true aphasia.  He had true aphasia with hi previous L.  MCA territory event.  He has a PhD in anatomy and is well versed in functional neuroanatomy.  We reviewed his MRI together   OBJECTIVE Temp:  [97.5 F (36.4 C)-98.3 F (36.8 C)] 97.7 F (36.5 C) (12/11 0923) Pulse Rate:  [56-86] 62 (12/11 0923) Cardiac Rhythm:  [-] Heart block (12/11 0847) Resp:  [12-20] 16 (12/11 0923) BP: (115-157)/(71-104) 131/85 mmHg (12/11 0923) SpO2:  [94 %-98 %] 97 % (12/11 0923) Weight:  [90.719 kg (200 lb)] 90.719 kg (200 lb) (12/10 1214)  CBC:  Recent Labs Lab 06/08/15 1200 06/08/15 1208  WBC 5.6  --   NEUTROABS 3.5  --   HGB 14.1 15.3  HCT 41.8 45.0  MCV 84.4  --   PLT 166  --     Basic Metabolic Panel:  Recent Labs Lab 06/08/15 1200 06/08/15 1208 06/09/15 0506  NA 137 140 140  K 3.9 3.8 4.2  CL 104 102 106  CO2 25  --  29  GLUCOSE 126* 119* 100*  BUN CREATININE 1.03 0.80 0.99  CALCIUM 9.1  --  8.8*    Lipid Panel:    Component Value Date/Time   CHOL 138 06/08/2015 1700   TRIG 52 06/08/2015 1700   HDL 60 06/08/2015 1700   CHOLHDL 2.3 06/08/2015 1700   VLDL 10 06/08/2015 1700   LDLCALC 68 06/08/2015 1700   HgbA1c: No results found for:  HGBA1C Urine Drug Screen:    Component Value Date/Time   LABOPIA NONE DETECTED 06/08/2015 1407   COCAINSCRNUR NONE DETECTED 06/08/2015 1407   LABBENZ NONE DETECTED 06/08/2015 1407   AMPHETMU NONE DETECTED 06/08/2015 1407   THCU NONE DETECTED 06/08/2015 1407   LABBARB NONE DETECTED 06/08/2015 1407      IMAGING  Dg Chest 2 View 06/08/2015   No active cardiopulmonary disease.   Ct Head Wo Contrast 06/08/2015   1. No acute intracranial abnormality.  2. Focal encephalomalacia involving the white matter at the corticomedullary junction of the left frontal lobe, either the sequela of prior intracranial hemorrhage or an old white matter stroke.   Troy Brain Wo Contrast 06/08/2015   1. Scattered small/punctate acute infarcts in the right MCA territory, including the right motor strip near the left upper extremity representation, right caudate.  2. No hemorrhage or mass effect.  3. Small chronic cortical infarct in the left MCA territory. Small chronic micro hemorrhage in the left cerebellum.    Troy Maxine Glenn Head/brain Wo Cm\ 06/08/2015   1. Negative anterior circulation. No right MCA branch occlusion or stenosis identified.  2. Mild to moderate distal right vertebral artery stenosis. Otherwise negative posterior circulation.   Physical Exam  General - Well nourished, well developed, in NAD   Cardiovascular - Regular rate and rhythm Pulmonary: CTA Abdomen: NT, ND, normal bowel sounds Extremities: No C/C/E  Neurological Exam Mental Status: Normal Orientation:  Oriented to person, place and time Speech:  Fluent; no dysarthria  Cranial Nerves:  PERRL; EOMI; visual fields full, face grossly symmetric, hearing grossly intact; shrug symmetric and tongue midline  Motor Exam:  Tone:  Within normal limits; Strength: 5/5 throughout  Sensory: Intact to light touch throughout  Coordination:  Intact finger to nose except for very slight dysmetria on the right  Gait:  Deferred  ASSESSMENT/PLAN Troy. Troy [name] is a 59 y.o. male with history of previous sJanice Coffintroke, TIA, and hypertension, presenting with sudden onset of left-sided numbness. He did not receive IV t-PA due to rapidly improving deficits.  Stroke:  Non-dominant infarcts probably embolic from an unknown source.  Resultant  Speech difficult and right sided weakness  MRI  Scattered small/punctate acute infarcts in the right MCA territory,  MRA no high-grade stenosis  Carotid Doppler pending  2D Echo:  Cards consulted and plan is a TEE instead given recent stroke history and known PFO  LDL - 68  HgbA1c pending  VTE prophylaxis - Lovenox  Diet Heart Room service appropriate?: Yes; Fluid consistency:: Thin  aspirin 325 mg daily prior to admission, now on aspirin 325 mg daily  Patient counseled to be compliant with his antithrombotic medications  Ongoing aggressive stroke risk factor management  Therapy recommendations:  Pending  Disposition:  Pending  Hypertension  Stable Permissive hypertension (OK if < 220/120) but gradually normalize in 5-7 days  Hyperlipidemia  Home meds:  Lipitor 20 mg daily resumed in hospital  LDL 68, goal < 70  Continue statin at discharge  Other Stroke Risk Factors  Advanced age  ETOH use  Hx stroke/TIA  Family hx stroke (brother)  Known PFO-- will need to evaluate further to determine if increased risk as a result   Other Active Problems  None  ATTENDING NOTE: Patient was seen and examined by me personally. Documentation from [place] [facility] will be very valuable to determine previous work-up.  The laboratory and radiographic studies reviewed by me.  ROS completed by me personally and there were no pertinent positives   PLAN  [facility]  Consult Cardiology to discuss next steps with PFO  Consider loop monitor  Ensure full hypercoagulable work-up has already been done.  completed by me personally and  fully documented above.  SIGNED BY: Dr. Alesia Bandahere Rocsi Hazelbaker     Hospital day # 1     To contact Stroke Continuity provider, please refer to [email or web address]. After hours, contact General Neurology

## 2015-06-09 NOTE — Progress Notes (Signed)
[facility] PROGRESS NOTE  Troy [name] AOZ:[id number] DOB: [birth year] DOA: 06/08/2015 PCP: No PCP Per Patient  Assessment/Plan: 1. Acute CVA. -Patient having history of hypertension, previous stroke in June 2016 where he was treated in [place]. A. fib was not found during that hospitalization and he was discharged on aspirin therapy. -He presents with complaints of left-sided numbness, MRI showing scattered right MCA territory infarcts.  -He remains on aspirin 325 mg by mouth daily and atorvastatin 20 mg by mouth daily. -Case was discussed with Dr Earl Lites of Neurology who recommended cardiac consultation and evaluation to address placement of LOOP recorder and consideration of PFO closure.  -I spoke with Dr Georganna Skeans who recommended a repeat TEE and Loop Recorder.  -Records from [facility] in [place] have been requested.   2.  HTN -Blood pressures are stable, off of that hypertensive agents.  3.  Dyslipidemia. -On atorvastatin 20 g by mouth daily  Code Status: Full code Family Communication:  Disposition Plan: Cardiology consulted, plan for transesophageal echocardiogram and Loop recorder   Consultants:  Neurology  Cardiology   HPI/Subjective: Troy [name] is a 59 year old male with a past medical history of CVA, recently moved to the area from [place]. He reports having a stroke in June 2016, undergoing a workup that included a transesophageal echocardiogram showing PFO. Patient was discharged on aspirin 81 mg by mouth daily. He reports seeing a cardiologist during that hospitalization. He presented with complaints of left-sided numbness. He was worked up with an MRI of her brain that showed scattered right MCA territory infarcts. He was admitted for CVA workup. Neurology consulted.  Objective: Filed Vitals:   06/09/15 0400 06/09/15 0923  BP: 134/87 131/85  Pulse: 61 62  Temp: 97.8 F (36.6 C) 97.7 F (36.5 C)  Resp: 16 16   No intake or output  data in the 24 hours ending 06/09/15 1057 Filed Weights   06/08/15 1214  Weight: 90.719 kg (200 lb)    Exam:   General:  He is awake and alert and in no acute distress.   Cardiovascular: Regula r rate and rhythm, normal S1S2  Respiratory: Normal S1S2  Abdomen: Soft, nontender nondistended  Musculoskeletal: No edema  Neurological: CN II-XII intact, no slurred speech, facial droop, had 5/5 muscle strength to B/L upper and lower extremities, with sensation in tact. 1+ b/l lower extremity DTR's.   Data Reviewed: Basic Metabolic Panel:  Recent Labs Lab 06/08/15 1200 06/08/15 1208 06/09/15 0506  NA 137 140 140  K 3.9 3.8 4.2  CL 104 102 106  CO2 25  --  29  GLUCOSE 126* 119* 100*  BUN CREATININE 1.03 0.80 0.99  CALCIUM 9.1  --  8.8*   Liver Function Tests:  Recent Labs Lab 06/08/15 1200  AST 56*  ALT 42  ALKPHOS 34*  BILITOT 0.8  PROT 6.5  ALBUMIN 3.8   No results for input(s): LIPASE, AMYLASE in the last 168 hours. No results for input(s): AMMONIA in the last 168 hours. CBC:  Recent Labs Lab 06/08/15 1200 06/08/15 1208  WBC 5.6  --   NEUTROABS 3.5  --   HGB 14.1 15.3  HCT 41.8 45.0  MCV 84.4  --   PLT 166  --    Cardiac Enzymes:  Recent Labs Lab 06/08/15 1940 06/09/15 0506  TROPONINI <0.03 <0.03   BNP (last 3 results) No results for input(s): BNP in the last 8760 hours.  ProBNP (last 3 results) No results for input(s):  PROBNP in the last 8760 hours.  CBG: No results for input(s): GLUCAP in the last 168 hours.  No results found for this or any previous visit (from the past 240 hour(s)).   Studies: Dg Chest 2 View  06/08/2015  CLINICAL DATA:  Acute left hand numbness. EXAM: CHEST  2 VIEW COMPARISON:  None. FINDINGS: The heart size and mediastinal contours are within normal limits. Both lungs are clear. No pneumothorax or pleural effusion is noted. The visualized skeletal structures are unremarkable. IMPRESSION: No active  cardiopulmonary disease. Electronically Signed   By: Lupita Raider, M.D.   On: 06/08/2015 16:28   Ct Head Wo Contrast  06/08/2015  CLINICAL DATA:  59 year old presenting with acute onset of left-sided weakness approximately 1 hour 15 min prior to imaging. Personal history of stroke in June, 2016. EXAM: CT HEAD WITHOUT CONTRAST TECHNIQUE: Contiguous axial images were obtained from the base of the skull through the vertex without intravenous contrast. COMPARISON:  No prior imaging is available for comparison. FINDINGS: Ventricular system normal in size and appearance for age. Encephalomalacia involving the white matter at the corticomedullary junction of the left frontal lobe (images 22, 23). No mass lesion. No midline shift. No acute hemorrhage or hematoma. No extra-axial fluid collections. No evidence of acute infarction. No skull fracture or other focal osseous abnormality involving the skull. Mild mucosal thickening involving anterior ethmoid air cells bilaterally and the base of the right maxillary sinus. Small mucous retention cyst or polyp in the right sphenoid sinus. Bilateral mastoid air cells and middle ear cavities well aerated. IMPRESSION: 1. No acute intracranial abnormality. 2. Focal encephalomalacia involving the white matter at the corticomedullary junction of the left frontal lobe, either the sequela of prior intracranial hemorrhage or an old white matter stroke. I telephoned these Code Stroke results at the time of interpretation on 06/08/2015 at 12:22 pm to Dr. Amada Jupiter of the neuro hospitalist service, who verbally acknowledged these results. Electronically Signed   By: Hulan Saas M.D.   On: 06/08/2015 12:26   Troy Brain Wo Contrast  06/08/2015  CLINICAL DATA:  59 year old male with left upper extremity numbness and difficulty speaking since 20/3 100 hours yesterday. Initial encounter. EXAM: MRI HEAD WITHOUT CONTRAST TECHNIQUE: Multiplanar, multiecho pulse sequences of the brain  and surrounding structures were obtained without intravenous contrast. COMPARISON:  Head CT without contrast 1210 hours today. FINDINGS: Cerebral volume is within normal limits for age. Major intracranial vascular flow voids are preserved. There is a mild degree of intracranial artery dolichoectasia. Subtle focus of abnormal trace diffusion in the right motor strip series 3, image 38. This is evident on the ADC map (image 38). There is also a small linear right parietal lobe subcortical white matter focus of restricted diffusion (series 3, image 33). Tiny focus also seen in the right caudate nucleus on image 30. There also appears to be a right lateral occipital lobe cortical focus along the posterior margin of the right MCA territory (image 24). No left hemisphere or posterior fossa restricted diffusion. No acute intracranial hemorrhage identified. No midline shift, mass effect, or evidence of intracranial mass lesion. Mild if any associated T2 hyperintensity at the abnormal diffusion sites. Chronic micro hemorrhage in the inferior left cerebellum. There is a small focus of left MCA cortical encephalomalacia on series 7, image 23. Negative visualized cervical spine. No ventriculomegaly. Normal bone marrow signal. Visible internal auditory structures appear normal. Mastoids are clear. Trace paranasal sinus mucosal thickening. Orbit and scalp soft tissues within  normal limits. IMPRESSION: 1. Scattered small/punctate acute infarcts in the right MCA territory, including the right motor strip near the left upper extremity representation, right caudate. 2. No hemorrhage or mass effect. 3. Small chronic cortical infarct in the left MCA territory. Small chronic micro hemorrhage in the left cerebellum. Electronically Signed   By: Odessa FlemingH  Hall M.D.   On: 06/08/2015 15:34   Troy Maxine GlennMra Head/brain Wo Cm  06/08/2015  CLINICAL DATA:  59 year old male with small acute right MCA infarcts today, left upper extremity numbness and  difficulty speaking since 2300 hours yesterday. Initial encounter. EXAM: MRA HEAD WITHOUT CONTRAST TECHNIQUE: Angiographic images of the Circle of Willis were obtained using MRA technique without intravenous contrast. COMPARISON:  Brain MRI 1432 hours today. Head CT without contrast 1210 hours. FINDINGS: Antegrade flow in the posterior circulation with fairly codominant distal vertebral arteries. Right PICA origin is normal. There is mild to moderate stenosis of the right V4 segment distal to the PICA (series 1106, image 7). No distal left vertebral artery stenosis. No basilar artery stenosis. AICA and SCA origins are within normal limits. PCA origins are within normal limits. Normal right posterior communicating artery, the left is diminutive or absent. Bilateral PCA branches are within normal limits. Antegrade flow in both ICA siphons. No siphon stenosis. Ophthalmic and right posterior communicating artery origins are normal. Patent carotid termini. Normal MCA and ACA origins. Anterior communicating artery and visualized bilateral ACA branches are within normal limits. Visualized left MCA branches are within normal limits; early left MCA bifurcation. Right MCA origin, M1 segment, and right MCA trifurcation are within normal limits. Visualized right MCA branches are within normal limits. IMPRESSION: 1. Negative anterior circulation. No right MCA branch occlusion or stenosis identified. 2. Mild to moderate distal right vertebral artery stenosis. Otherwise negative posterior circulation. Electronically Signed   By: Odessa FlemingH  Hall M.D.   On: 06/08/2015 17:17    Scheduled Meds: . aspirin  325 mg Oral Daily  . atorvastatin  20 mg Oral Daily  . enoxaparin (LOVENOX) injection  30 mg Subcutaneous Q24H  . PARoxetine  10 mg Oral Daily   Continuous Infusions: . sodium chloride 75 mL/hr at 06/09/15 [id number]    Active Problems:   TIA (transient ischemic attack)   Hypertension   Stroke (cerebrum) (HCC)   Aphasia   Left  arm numbness    Time spent: 30 min    Troy BennettZAMORA, Donnald Tabar  [facility] Pager [phone]. If 7PM-7AM, please contact night-coverage at [email or web address], password [email or web address] 06/09/2015, 10:57 AM  LOS: 1 day

## 2015-06-09 NOTE — Progress Notes (Signed)
VASCULAR LAB PRELIMINARY  PRELIMINARY  PRELIMINARY  PRELIMINARY  Carotid duplex completed.    Preliminary report:  1-39% ICA plaquing.  Vertebral artery flow is antegrade.   Starr Engel, RVT 06/09/2015, 12:47 PM

## 2015-06-09 NOTE — Progress Notes (Signed)
DrDiona Browner.. McDowell reviewed TTE- today.  He would not do TEE until we could review the previous TEE.  Please get copy.  We will arrange for LINq.

## 2015-06-10 ENCOUNTER — Inpatient Hospital Stay (HOSPITAL_COMMUNITY): Payer: BC Managed Care – PPO

## 2015-06-10 DIAGNOSIS — R208 Other disturbances of skin sensation: Secondary | ICD-10-CM

## 2015-06-10 DIAGNOSIS — I639 Cerebral infarction, unspecified: Secondary | ICD-10-CM

## 2015-06-10 LAB — HEMOGLOBIN A1C
Hgb A1c MFr Bld: 5.8 % — ABNORMAL HIGH (ref 4.8–5.6)
Mean Plasma Glucose: 120 mg/dL

## 2015-06-10 LAB — SURGICAL PCR SCREEN
MRSA, PCR: NEGATIVE
Staphylococcus aureus: POSITIVE — AB

## 2015-06-10 MED ORDER — CLOPIDOGREL BISULFATE 75 MG PO TABS: 75.0000 mg | ORAL_TABLET | Freq: Every day | ORAL | Status: DC

## 2015-06-10 MED ORDER — ASPIRIN EC 81 MG PO TBEC: 81.0000 mg | DELAYED_RELEASE_TABLET | Freq: Every day | ORAL | Status: AC

## 2015-06-10 NOTE — Progress Notes (Signed)
VASCULAR LAB PRELIMINARY  PRELIMINARY  PRELIMINARY  PRELIMINARY  Bilateral lower extremity venous duplex completed.    Preliminary report:  There is no DVT, SVT, or Baker's cyst noted in the bilateral lower extremities.   Lalanya Rufener, RVT 06/10/2015, 10:07 AM

## 2015-06-10 NOTE — Progress Notes (Signed)
Discharge orders received, Pt for discharge home today . IV d/c'd. D/c instructions and RX given with verbalized understanding.. Staff bought pt downstairs via wheelchair. 06/10/15 1229

## 2015-06-10 NOTE — Progress Notes (Signed)
STROKE TEAM PROGRESS NOTE   HISTORY Troy [name] is a 59 y.o. male with a history of previous stroke (felt to be ischemic with hemorrhagic conversion) in 2010 who presents with sudden onset left-sided numbness started earlier today (LKW 06/08/2015 at 1100). He states that his arms completely resolved with the exception of mild difficulty with moving his left thumb. tpa was not given due to rapidly improving mild symptoms   SUBJECTIVE (INTERVAL HISTORY) Patient recounted history with Dr. Pearlean [name]. Neuro symptoms resolved. He really would like to have some coffee.   OBJECTIVE Temp:  [97.7 F (36.5 C)-99.3 F (37.4 C)] 97.9 F (36.6 C) (12/12 0600) Pulse Rate:  [57-63] 58 (12/12 0600) Cardiac Rhythm:  [-] Heart block (12/11 1940) Resp:  [16-20] 16 (12/12 0159) BP: (130-157)/(83-96) 130/84 mmHg (12/12 0600) SpO2:  [95 %-98 %] 96 % (12/12 0600)  CBC:   Recent Labs Lab 06/08/15 1200 06/08/15 1208  WBC 5.6  --   NEUTROABS 3.5  --   HGB 14.1 15.3  HCT 41.8 45.0  MCV 84.4  --   PLT 166  --     Basic Metabolic Panel:   Recent Labs Lab 06/08/15 1200 06/08/15 1208 06/09/15 0506  NA 137 140 140  K 3.9 3.8 4.2  CL 104 102 106  CO2 25  --  29  GLUCOSE 126* 119* 100*  BUN CREATININE 1.03 0.80 0.99  CALCIUM 9.1  --  8.8*    Lipid Panel:     Component Value Date/Time   CHOL 138 06/08/2015 1700   TRIG 52 06/08/2015 1700   HDL 60 06/08/2015 1700   CHOLHDL 2.3 06/08/2015 1700   VLDL 10 06/08/2015 1700   LDLCALC 68 06/08/2015 1700   HgbA1c: No results found for: HGBA1C Urine Drug Screen:     Component Value Date/Time   LABOPIA NONE DETECTED 06/08/2015 1407   COCAINSCRNUR NONE DETECTED 06/08/2015 1407   LABBENZ NONE DETECTED 06/08/2015 1407   AMPHETMU NONE DETECTED 06/08/2015 1407   THCU NONE DETECTED 06/08/2015 1407   LABBARB NONE DETECTED 06/08/2015 1407      IMAGING  Dg Chest 2 View 06/08/2015   No active cardiopulmonary disease.   Ct Head Wo  Contrast 06/08/2015   1. No acute intracranial abnormality.  2. Focal encephalomalacia involving the white matter at the corticomedullary junction of the left frontal lobe, either the sequela of prior intracranial hemorrhage or an old white matter stroke.   Mr Brain Wo Contrast 06/08/2015   1. Scattered small/punctate acute infarcts in the right MCA territory, including the right motor strip near the left upper extremity representation, right caudate.  2. No hemorrhage or mass effect.  3. Small chronic cortical infarct in the left MCA territory. Small chronic micro hemorrhage in the left cerebellum.   Mr Troy [name] Head/brain Wo Cm\ 06/08/2015   1. Negative anterior circulation. No right MCA branch occlusion or stenosis identified.  2. Mild to moderate distal right vertebral artery stenosis. Otherwise negative posterior circulation.   2D Echocardiogram  - Left ventricle: The cavity size was normal. Wall thickness wasnormal. Systolic function was normal. The estimated ejectionfraction was in the range of 55% to 60%. Wall motion was normal;there were no regional wall motion abnormalities. Dopplerparameters are consistent with abnormal left ventricularrelaxation (grade 1 diastolic dysfunction). - Aortic valve: There was no stenosis. - Aorta: Borderline dilated aortic root. Ascending aorta dimension:4.0 cm. Aortic root dimension: 38 mm (ED). - Mitral valve: There was trivial regurgitation. - Left  atrium: The atrium was mildly dilated. - Right ventricle: The cavity size was normal. Systolic functionwas normal. - Pulmonary arteries: No complete TR doppler jet so unable toestimate PA systolic pressure. - Inferior vena cava: The vessel was normal in size. Therespirophasic diameter changes were in the normal range (= 50%),consistent with normal central venous pressure. Impressions:   Normal LV size with EF 55-60%. Normal RV size and systolicfunction. No significant valvular abnormalities. Mildly  dilatedaortic root/ascending aorta.  Carotid Doppler   There is 1-39% bilateral ICA stenosis. Vertebral artery flow is antegrade.    LE Venous Doppler There is no DVT, SVT, or Baker's cyst noted in the bilateral lower extremities.    Physical Exam General - Well nourished, well developed, in NAD   Cardiovascular - Regular rate and rhythm Pulmonary: CTA Abdomen: NT, ND, normal bowel sounds Extremities: No C/C/E Neurological Exam Mental Status: Normal Orientation:  Oriented to person, place and time Speech:  Fluent; no dysarthria Cranial Nerves:  PERRL; EOMI; visual fields full, face grossly symmetric, hearing grossly intact; shrug symmetric and tongue midline Motor Exam:  Tone:  Within normal limits; Strength: 5/5 throughout Sensory: Intact to light touch throughout Coordination:  Intact finger to nose except for very slight dysmetria on the right Gait: Deferred   ASSESSMENT/PLAN [name] is a 59 y.o. male with history of previous stroke, TIA, and hypertension, presenting with sudden onset of left-sided numbness. He did not receive IV t-PA due to rapidly improving deficits.  Stroke:  Non-dominant R MCA white matter territory infarcts secondary to small vessel disease in setting of previous cortical embolic infarct with heavy lifting, PFO found.  Stroke occurred while he was brushing his teeth (no heavy lifting)  Resultant  Speech difficult and right sided weakness  MRI  Scattered small/punctate acute infarcts in the right MCA territory,  MRA no high-grade stenosis  Carotid Doppler pending   2D Echo:  No source of embolus  LE venous doppler neg for VTE  Agree, no need to repeat TEE   Given previous embolic stroke with lifting, reasonable to consider PFO closure. Cardiology to see as an OP. They will make appt for him during the next 1-2 days, anticipate he will be seen within ~week .  LDL - 68  HgbA1c pending  VTE prophylaxis - Lovenox Diet Heart  Room service appropriate?: Yes; Fluid consistency:: Thin  aspirin 325 mg daily prior to admission, now on aspirin 325 mg daily. Recommend change to dual antiplatelets given PFO and possible planned closure  Patient counseled to be compliant with his antithrombotic medications  Ongoing aggressive stroke risk factor management  Therapy recommendations:  No PT  Disposition:  Pending  PFO  Cardiology consulted  No TEE planned  Cardiology plans to close as an OP  Hypertension  Stable Permissive hypertension (OK if < 220/120) but gradually normalize in 5-7 days  Hyperlipidemia  Home meds:  Lipitor 20 mg daily resumed in hospital  LDL 68, goal < 70  Continue statin at discharge  Other Stroke Risk Factors  Advanced age  ETOH use  Hx stroke/TIA   June 2016 - embolic, PFO found, discussed closure at that time, was doing heavy lifting at time of stroke  Family hx stroke (brother)  Other Active Problems  None  Dr. Pearlean [name] discussed with DR. Vanessa Barbara. Dual antiplatelets recommended at d./c. Follow up with Dr. Excell Seltzer for possible PFO closure. Ok for discharge home today. Follow up Dr. Pearlean [name] in 1 month. Order written.  Hospital day #  2  Rhoderick MoodyBIBY,SHARON  [facility] See Amion for Pager information 06/10/2015 11:27 AM  I have personally examined this patient, reviewed notes, independently viewed imaging studies, participated in medical decision making and plan of care. I have made any additions or clarifications directly to the above note. Agree with note above.    Delia HeadyPramod Daden Mahany, MD Medical Director [facility] Pager: [phone] 06/10/2015 5:46 PM  To contact Stroke Continuity provider, please refer to [email or web address]. After hours, contact General Neurology

## 2015-06-10 NOTE — Discharge Summary (Signed)
Physician Discharge Summary  Haseeb Fiallos ZOX:[id number] DOB: Sep 29, 1955 DOA: 06/08/2015  PCP: No PCP Per Patient  Admit date: 06/08/2015 Discharge date: 06/10/2015  Time spent: 35 minutes  Recommendations for Outpatient Follow-up:  1. Patient was given referral to cardiology regarding closure of PFO 2. He presented with right MCA territory infarct, discharged on dual agent antiplatelet therapy with aspirin and Plavix per neurology recommendations  Discharge Diagnoses:  Active Problems:   TIA (transient ischemic attack)   Hypertension   Stroke (cerebrum) (HCC)   Aphasia   Left arm numbness   Acute [facility] ([facility])   Discharge Condition: Stable  Diet recommendation: Heart healthy  Filed Weights   06/08/15 1214  Weight: 90.719 kg (200 lb)    History of present illness:  Troy [name] is a 59 y.o. male with a past medical history that includes stroke in June 2016, attention presents to the emergency department with the chief complaint of expressive aphasia and left arm weakness and numbness. He presented to the emergency department code stroke was called and he was evaluated by Dr. Amada Jupiter with neurology who opined TIA not TPA candidate due to resolution of symptoms were recommended admission for TIA workup.  She reports getting up this morning in the process of brushes teeth and come in his hair he developed left arm and left hand numbness. So sedated symptoms include word searching and expressive aphasia while trying to call EMS. He reports taking 325 mg of aspirin and waiting for the ambulance. He reports symptoms resolved while in the ambulance ride to the hospital. He denies any chest pain palpitations headache dizziness visual disturbances syncope or near-syncope. He denies any abdominal pain nausea vomiting diarrhea. He denies any recent illness fever chills or decreased oral intake.  Hospital Course:  Mr Scioneaux is a 59 year old male with a past  medical history of CVA, recently moved to the area from [place]. He reports having a stroke in June 2016, undergoing a workup that included a transesophageal echocardiogram showing PFO. Patient was discharged on aspirin 81 mg by mouth daily. He reports seeing a cardiologist during that hospitalization. He presented with complaints of left-sided numbness. He was worked up with an MRI of her brain that showed scattered right MCA territory infarcts. He was admitted for CVA workup. Neurology consulted.   Acute CVA. -Patient having history of hypertension, previous stroke in June 2016 where he was treated in [place]. A. fib was not found during that hospitalization and he was discharged on aspirin therapy. -He presents with complaints of left-sided numbness, MRI showing scattered right MCA territory infarcts.  -He remains on aspirin 325 mg by mouth daily and atorvastatin 20 mg by mouth daily. -Case was discussed with Dr Earl Lites of Neurology who recommended cardiac consultation and evaluation to address placement of LOOP recorder and consideration of PFO closure.  -Records from [facility] in [place] have been requested.  -On 06/10/2015 I discussed case with Dr. Pearlean Brownie of neurology who recommended outpatient cardiology follow-up regarding addressing PFO closure. He was given an appointment to follow-up with Dr. Excell Seltzer within a week. Dr. said he also recommended treating with dual agent antiplatelet therapy with aspirin and Plavix. He was discharged on aspirin 81 mg by mouth daily and Plavix any 5 mg by mouth daily. -Neurology follow-up in one month.  2. HTN -He was discharged on Maxide  3. Dyslipidemia. -On atorvastatin 20 g by mouth daily  Procedures:  Transthoracic echocardiogram Impression: Left ventricle: The cavity size was normal. Wall  thickness was normal. Systolic function was normal. The estimated ejection fraction was in the range of 55% to 60%. Wall motion was  normal; there were no regional wall motion abnormalities. Doppler parameters are consistent with abnormal left ventricular relaxation (grade 1 diastolic dysfunction).  Carotid Dopplers: Preliminary report: 1-39% ICA plaquing. Vertebral artery flow is antegrade.   Consultations:  Neurology  Discharge Exam: Filed Vitals:   06/10/15 0159 06/10/15 0600  BP: 140/90 130/84  Pulse: 63 58  Temp: 97.9 F (36.6 C) 97.9 F (36.6 C)  Resp: 16      General: He is awake and alert and in no acute distress.   Cardiovascular: Regula r rate and rhythm, normal S1S2  Respiratory: Normal S1S2  Abdomen: Soft, nontender nondistended  Musculoskeletal: No edema  Neurological: CN II-XII intact, no slurred speech, facial droop, had 5/5 muscle strength to B/L upper and lower extremities, with sensation in tact. 1+ b/l lower extremity DTR's.  Discharge Instructions    Current Discharge Medication List    START taking these medications   Details  aspirin EC 81 MG tablet Take 1 tablet (81 mg total) by mouth daily. Qty: 30 tablet, Refills: 0    clopidogrel (PLAVIX) 75 MG tablet Take 1 tablet (75 mg total) by mouth daily. Qty: 30 tablet, Refills: 0      CONTINUE these medications which have NOT CHANGED   Details  atorvastatin (LIPITOR) 20 MG tablet Take 1 tablet by mouth daily. Refills: 7    PARoxetine (PAXIL) 10 MG tablet Take 1 tablet by mouth daily. Refills: 0    triamterene-hydrochlorothiazide (MAXZIDE-25) 37.5-25 MG tablet Take 1 tablet by mouth daily. Refills: 0      STOP taking these medications     aspirin 325 MG tablet      diclofenac (VOLTAREN) 75 MG EC tablet        No Known Allergies Follow-up Information    Follow up with SETHI,PRAMOD, MD In 4 weeks.   Specialties:  Neurology, Radiology   Contact information:   [address] [place] [address] [phone]       Follow up with Tonny Bollman, MD In 1 week.   Specialty:  Cardiology    Contact information:   [address] [place] [address] [phone]        The results of significant diagnostics from this hospitalization (including imaging, microbiology, ancillary and laboratory) are listed below for reference.    Significant Diagnostic Studies: Dg Chest 2 View  06/08/2015  CLINICAL DATA:  Acute left hand numbness. EXAM: CHEST  2 VIEW COMPARISON:  None. FINDINGS: The heart size and mediastinal contours are within normal limits. Both lungs are clear. No pneumothorax or pleural effusion is noted. The visualized skeletal structures are unremarkable. IMPRESSION: No active cardiopulmonary disease. Electronically Signed   By: Lupita Raider, M.D.   On: 06/08/2015 16:28   Ct Head Wo Contrast  06/08/2015  CLINICAL DATA:  59 year old presenting with acute onset of left-sided weakness approximately 1 hour 15 min prior to imaging. Personal history of stroke in June, 2016. EXAM: CT HEAD WITHOUT CONTRAST TECHNIQUE: Contiguous axial images were obtained from the base of the skull through the vertex without intravenous contrast. COMPARISON:  No prior imaging is available for comparison. FINDINGS: Ventricular system normal in size and appearance for age. Encephalomalacia involving the white matter at the corticomedullary junction of the left frontal lobe (images 22, 23). No mass lesion. No midline shift. No acute hemorrhage or  hematoma. No extra-axial fluid collections. No evidence of acute infarction. No skull fracture or other focal osseous abnormality involving the skull. Mild mucosal thickening involving anterior ethmoid air cells bilaterally and the base of the right maxillary sinus. Small mucous retention cyst or polyp in the right sphenoid sinus. Bilateral mastoid air cells and middle ear cavities well aerated. IMPRESSION: 1. No acute intracranial abnormality. 2. Focal encephalomalacia involving the white matter at the corticomedullary junction of the left  frontal lobe, either the sequela of prior intracranial hemorrhage or an old white matter stroke. I telephoned these Code Stroke results at the time of interpretation on 06/08/2015 at 12:22 pm to Dr. Amada JupiterKirkpatrick of the neuro hospitalist service, who verbally acknowledged these results. Electronically Signed   By: Hulan Saashomas  Lawrence M.D.   On: 06/08/2015 12:26   Mr Brain Wo Contrast  06/08/2015  CLINICAL DATA:  59 year old male with left upper extremity numbness and difficulty speaking since 20/3 100 hours yesterday. Initial encounter. EXAM: MRI HEAD WITHOUT CONTRAST TECHNIQUE: Multiplanar, multiecho pulse sequences of the brain and surrounding structures were obtained without intravenous contrast. COMPARISON:  Head CT without contrast 1210 hours today. FINDINGS: Cerebral volume is within normal limits for age. Major intracranial vascular flow voids are preserved. There is a mild degree of intracranial artery dolichoectasia. Subtle focus of abnormal trace diffusion in the right motor strip series 3, image 38. This is evident on the ADC map (image 38). There is also a small linear right parietal lobe subcortical white matter focus of restricted diffusion (series 3, image 33). Tiny focus also seen in the right caudate nucleus on image 30. There also appears to be a right lateral occipital lobe cortical focus along the posterior margin of the right MCA territory (image 24). No left hemisphere or posterior fossa restricted diffusion. No acute intracranial hemorrhage identified. No midline shift, mass effect, or evidence of intracranial mass lesion. Mild if any associated T2 hyperintensity at the abnormal diffusion sites. Chronic micro hemorrhage in the inferior left cerebellum. There is a small focus of left MCA cortical encephalomalacia on series 7, image 23. Negative visualized cervical spine. No ventriculomegaly. Normal bone marrow signal. Visible internal auditory structures appear normal. Mastoids are clear. Trace  paranasal sinus mucosal thickening. Orbit and scalp soft tissues within normal limits. IMPRESSION: 1. Scattered small/punctate acute infarcts in the right MCA territory, including the right motor strip near the left upper extremity representation, right caudate. 2. No hemorrhage or mass effect. 3. Small chronic cortical infarct in the left MCA territory. Small chronic micro hemorrhage in the left cerebellum. Electronically Signed   By: Odessa FlemingH  Hall M.D.   On: 06/08/2015 15:34   Mr Maxine GlennMra Head/brain Wo Cm  06/08/2015  CLINICAL DATA:  59 year old male with small acute right MCA infarcts today, left upper extremity numbness and difficulty speaking since 2300 hours yesterday. Initial encounter. EXAM: MRA HEAD WITHOUT CONTRAST TECHNIQUE: Angiographic images of the Circle of Willis were obtained using MRA technique without intravenous contrast. COMPARISON:  Brain MRI 1432 hours today. Head CT without contrast 1210 hours. FINDINGS: Antegrade flow in the posterior circulation with fairly codominant distal vertebral arteries. Right PICA origin is normal. There is mild to moderate stenosis of the right V4 segment distal to the PICA (series 1106, image 7). No distal left vertebral artery stenosis. No basilar artery stenosis. AICA and SCA origins are within normal limits. PCA origins are within normal limits. Normal right posterior communicating artery, the left is diminutive or absent. Bilateral PCA branches are within  normal limits. Antegrade flow in both ICA siphons. No siphon stenosis. Ophthalmic and right posterior communicating artery origins are normal. Patent carotid termini. Normal MCA and ACA origins. Anterior communicating artery and visualized bilateral ACA branches are within normal limits. Visualized left MCA branches are within normal limits; early left MCA bifurcation. Right MCA origin, M1 segment, and right MCA trifurcation are within normal limits. Visualized right MCA branches are within normal limits.  IMPRESSION: 1. Negative anterior circulation. No right MCA branch occlusion or stenosis identified. 2. Mild to moderate distal right vertebral artery stenosis. Otherwise negative posterior circulation. Electronically Signed   By: Odessa Fleming M.D.   On: 06/08/2015 17:17    Microbiology: Recent Results (from the past 240 hour(s))  Surgical pcr screen     Status: Abnormal   Collection Time: 06/09/15 11:32 PM  Result Value Ref Range Status   MRSA, PCR NEGATIVE NEGATIVE Final   Staphylococcus aureus POSITIVE (A) NEGATIVE Final    Comment:        The Xpert SA Assay (FDA approved for NASAL specimens in patients over 23 years of age), is one component of a comprehensive surveillance program.  Test performance has been validated by [facility] for patients greater than or equal to 61 year old. It is not intended to diagnose infection nor to guide or monitor treatment.      Labs: Basic Metabolic Panel:  Recent Labs Lab 06/08/15 1200 06/08/15 1208 06/09/15 0506  NA 137 140 140  K 3.9 3.8 4.2  CL 104 102 106  CO2 25  --  29  GLUCOSE 126* 119* 100*  BUN CREATININE 1.03 0.80 0.99  CALCIUM 9.1  --  8.8*   Liver Function Tests:  Recent Labs Lab 06/08/15 1200  AST 56*  ALT 42  ALKPHOS 34*  BILITOT 0.8  PROT 6.5  ALBUMIN 3.8   No results for input(s): LIPASE, AMYLASE in the last 168 hours. No results for input(s): AMMONIA in the last 168 hours. CBC:  Recent Labs Lab 06/08/15 1200 06/08/15 1208  WBC 5.6  --   NEUTROABS 3.5  --   HGB 14.1 15.3  HCT 41.8 45.0  MCV 84.4  --   PLT 166  --    Cardiac Enzymes:  Recent Labs Lab 06/08/15 1940 06/09/15 0506  TROPONINI <0.03 <0.03   BNP: BNP (last 3 results) No results for input(s): BNP in the last 8760 hours.  ProBNP (last 3 results) No results for input(s): PROBNP in the last 8760 hours.  CBG: No results for input(s): GLUCAP in the last 168 hours.     SignedJeralyn Bennett  [facility] 06/10/2015, 11:39 AM

## 2015-06-14 ENCOUNTER — Encounter: Payer: Self-pay | Admitting: Cardiovascular Disease

## 2015-06-14 ENCOUNTER — Ambulatory Visit (INDEPENDENT_AMBULATORY_CARE_PROVIDER_SITE_OTHER): Payer: BC Managed Care – PPO | Admitting: Cardiovascular Disease

## 2015-06-14 VITALS — BP 132/88 | HR 66 | Ht 72.0 in | Wt 208.8 lb

## 2015-06-14 DIAGNOSIS — Q211 Atrial septal defect: Secondary | ICD-10-CM | POA: Diagnosis not present

## 2015-06-14 DIAGNOSIS — I253 Aneurysm of heart: Secondary | ICD-10-CM

## 2015-06-14 NOTE — Patient Instructions (Signed)
Medication Instructions:  Your physician recommends that you continue on your current medications as directed. Please refer to the Current Medication list given to you today.  Labwork: No new orders.   Testing/Procedures: Dr Excell Seltzerooper has recommended PFO closure.  Please follow pre-procedure instructions.  Follow-Up: We will arrange further follow-up after PFO closure.   Any Other Special Instructions Will Be Listed Below (If Applicable).  Your physician discussed the importance of taking an antibiotic prior to any dental, gastrointestinal, genitourinary procedures to prevent damage to the heart valves from infection. This is required the first 6 months after closure.      If you need a refill on your cardiac medications before your next appointment, please call your pharmacy.

## 2015-06-16 NOTE — Progress Notes (Signed)
Cardiology Office Note Date:  06/16/2015   ID:  Troy CoffinRobert [name], DOB 03-03-1956, MRN [id number]  PCP:  No PCP Per Patient  Cardiologist:  Tonny Bollmanooper, Viktorya Arguijo, MD    Chief Complaint  Patient presents with  . PFO Evaluation    History of Present Illness: Troy CoffinRobert [name] is a 59 y.o. male who presents for PFO and cryptogenic stroke. He recently presented with aphasia and left arm weakness, resolving spontaneously after a brief period of time without indication for TPA or acute intervention. He was taking ASA at the time of this event. He reports no residual deficit at this time. The patient had an initial stroke in [place] when he presented with aphasia and right facial numbness after heavy lifting earlier this year. He has been treated with ASA and atorvastatin since that time. MRI of the brain showed small/punctate acute infarcts in the right MCA territory. At time of prior event in June he was found to have a 7/17 mm PFO with aneurysmal atrial septum. LE doppler showed no evidence of DVT. Medical therapy with ASA was recommended.   The patient is currently feeling well. He has no cardiopulmonary symptoms. Today, he denies symptoms of palpitations, chest pain, shortness of breath, orthopnea, PND, lower extremity edema, dizziness, or syncope.    Past Medical History  Diagnosis Date  . Stroke [facility](H[facility])     Bleed in June 2016  . Hypertension   . TIA (transient ischemic attack)     Past Surgical History  Procedure Laterality Date  . Shoulder surgery Right   . Knee surgery Right     Current Outpatient Prescriptions  Medication Sig Dispense Refill  . aspirin EC 81 MG tablet Take 1 tablet (81 mg total) by mouth daily. 30 tablet 0  . atorvastatin (LIPITOR) 20 MG tablet Take 1 tablet by mouth daily.  7  . clopidogrel (PLAVIX) 75 MG tablet Take 1 tablet (75 mg total) by mouth daily. 30 tablet 0  . PARoxetine (PAXIL) 10 MG tablet Take 1 tablet by mouth daily.  0  .  triamterene-hydrochlorothiazide (MAXZIDE-25) 37.5-25 MG tablet Take 1 tablet by mouth daily.  0   No current facility-administered medications for this visit.    Allergies:   Review of patient's allergies indicates no known allergies.   Social History:  The patient  reports that he has never smoked. He does not have any smokeless tobacco history on file. He reports that he drinks about 1.2 oz of alcohol per week. He reports that he does not use illicit drugs.   Family History:  The patient's identical twin brother has had a stroke, reportedly hemorrhagic.     ROS:  Please see the history of present illness.  All other systems are reviewed and negative.    PHYSICAL EXAM: VS:  BP 132/88 mmHg  Pulse 66  Ht 6' (1.829 m)  Wt 208 lb 12.8 oz (94.711 kg)  BMI 28.31 kg/m2  SpO2 95% , BMI Body mass index is 28.31 kg/(m^2). GEN: Well nourished, well developed, in no acute distress HEENT: normal Neck: no JVD, no masses. No carotid bruits Cardiac: RRR without murmur or gallop                Respiratory:  clear to auscultation bilaterally, normal work of breathing GI: soft, nontender, nondistended, + BS MS: no deformity or atrophy Ext: no pretibial edema, pedal pulses 2+= bilaterally Skin: warm and dry, no rash Neuro:  Strength and sensation are intact Psych: euthymic mood, full affect  EKG:  EKG is not ordered today.  Recent Labs: 06/08/2015: ALT 42; Hemoglobin 15.3; Platelets 166 06/09/2015: BUN 10; Creatinine, Ser 0.99; Potassium 4.2; Sodium 140   Lipid Panel     Component Value Date/Time   CHOL 138 06/08/2015 1700   TRIG 52 06/08/2015 1700   HDL 60 06/08/2015 1700   CHOLHDL 2.3 06/08/2015 1700   VLDL 10 06/08/2015 1700   LDLCALC 68 06/08/2015 1700      Wt Readings from Last 3 Encounters:  06/14/15 208 lb 12.8 oz (94.711 kg)  06/08/15 200 lb (90.719 kg)     Cardiac Studies Reviewed: TEE as above - reported in discharge summary from [facility]  2D Echo  06-09-2015 Study Conclusions  - Left ventricle: The cavity size was normal. Wall thickness was normal. Systolic function was normal. The estimated ejection fraction was in the range of 55% to 60%. Wall motion was normal; there were no regional wall motion abnormalities. Doppler parameters are consistent with abnormal left ventricular relaxation (grade 1 diastolic dysfunction). - Aortic valve: There was no stenosis. - Aorta: Borderline dilated aortic root. Ascending aorta dimension: 4.0 cm. Aortic root dimension: 38 mm (ED). - Mitral valve: There was trivial regurgitation. - Left atrium: The atrium was mildly dilated. - Right ventricle: The cavity size was normal. Systolic function was normal. - Pulmonary arteries: No complete TR doppler jet so unable to estimate PA systolic pressure. - Inferior vena cava: The vessel was normal in size. The respirophasic diameter changes were in the normal range (= 50%), consistent with normal central venous pressure.  Impressions:  - Normal LV size with EF 55-60%. Normal RV size and systolic function. No significant valvular abnormalities. Mildly dilated aortic root/ascending aorta.  ASSESSMENT AND PLAN: PFO, recurrent cryptogenic stroke on medical therapy. We have reviewed common incidence of PFO in the general population, increased incidence in the cryptogenic stroke population, and potential mechanism of action of paradoxical embolus. This patient has been found to have a large PFO (11x17) described on outside TEE, an initial stroke occurring with heavy lifting, and now a recurrent stroke on medical therapy. We've reviewed randomized controlled trial data of transcatheter PFO closure versus medical therapy, and pros and cons of each approach. Considering above factors, transcatheter closure seems to be an appropriate treatment option for this patient and he would like to proceed.   I reviewed the risks, indications, and  alternatives to transcatheter closure with the patient. Specific risks include bleeding, infection, device embolization, stroke, cardiac perforation, tamponade, arrhythmia, MI, and late device erosion. He understands these serious risks occur at low incidence of < 1%. He understands the need for 6 months of dual antiplatelet therapy with aspirin and clopidogrel, as well as the need for SBE prophylaxis for 6 months.  Current medicines are reviewed with the patient today.  The patient does not have concerns regarding medicines.  Labs/ tests ordered today include:  No orders of the defined types were placed in this encounter.   Enzo Bi, MD  06/16/2015 10:03 AM    [facility] [address] [place], [place], [place]  [address] Phone: [phone]; Fax: [phone]

## 2015-06-19 ENCOUNTER — Encounter: Payer: Self-pay | Admitting: Cardiovascular Disease

## 2015-06-19 NOTE — Telephone Encounter (Signed)
This encounter was created in error - please disregard.

## 2015-06-19 NOTE — Telephone Encounter (Signed)
New problem   Pt need call back concerning upcoming procedure. Please call pt.

## 2015-07-02 ENCOUNTER — Encounter: Payer: Self-pay | Admitting: Cardiovascular Disease

## 2015-07-02 NOTE — Telephone Encounter (Signed)
New message     Patient calling has the TEE images - will be overnight to the office.

## 2015-07-03 ENCOUNTER — Encounter: Payer: Self-pay | Admitting: Cardiovascular Disease

## 2015-07-11 ENCOUNTER — Telehealth: Payer: Self-pay | Admitting: Cardiovascular Disease

## 2015-07-11 NOTE — Telephone Encounter (Signed)
This encounter was created in error - please disregard.

## 2015-07-11 NOTE — Telephone Encounter (Signed)
New message     Talk to the nurse regarding the procedure scheduled for Monday.  He has questions

## 2015-07-11 NOTE — Telephone Encounter (Signed)
Dr Excell Seltzerooper called the pt and the pt will try to bring CD to the hospital for case on Monday.

## 2015-07-15 ENCOUNTER — Encounter (HOSPITAL_COMMUNITY): Payer: Self-pay | Admitting: Cardiovascular Disease

## 2015-07-15 ENCOUNTER — Ambulatory Visit (HOSPITAL_COMMUNITY)
Admission: RE | Admit: 2015-07-15 | Discharge: 2015-07-16 | Disposition: A | Discharge status: Home / Self Care | Payer: BC Managed Care – PPO | Source: Ambulatory Visit | Attending: Cardiovascular Disease | Admitting: Cardiovascular Disease

## 2015-07-15 ENCOUNTER — Encounter (HOSPITAL_COMMUNITY)
Admission: RE | Disposition: A | Discharge status: Home / Self Care | Payer: Self-pay | Source: Ambulatory Visit | Attending: Cardiovascular Disease

## 2015-07-15 DIAGNOSIS — Z79899 Other long term (current) drug therapy: Secondary | ICD-10-CM | POA: Diagnosis not present

## 2015-07-15 DIAGNOSIS — Z8673 Personal history of transient ischemic attack (TIA), and cerebral infarction without residual deficits: Secondary | ICD-10-CM | POA: Insufficient documentation

## 2015-07-15 DIAGNOSIS — Q211 Atrial septal defect: Secondary | ICD-10-CM | POA: Diagnosis present

## 2015-07-15 DIAGNOSIS — I10 Essential (primary) hypertension: Secondary | ICD-10-CM | POA: Diagnosis present

## 2015-07-15 DIAGNOSIS — Q2112 Patent foramen ovale: Secondary | ICD-10-CM

## 2015-07-15 DIAGNOSIS — Z7902 Long term (current) use of antithrombotics/antiplatelets: Secondary | ICD-10-CM | POA: Insufficient documentation

## 2015-07-15 DIAGNOSIS — I253 Aneurysm of heart: Secondary | ICD-10-CM | POA: Diagnosis not present

## 2015-07-15 DIAGNOSIS — Z7982 Long term (current) use of aspirin: Secondary | ICD-10-CM | POA: Diagnosis not present

## 2015-07-15 DIAGNOSIS — G459 Transient cerebral ischemic attack, unspecified: Secondary | ICD-10-CM | POA: Diagnosis present

## 2015-07-15 HISTORY — DX: Major depressive disorder, single episode, unspecified: F32.9

## 2015-07-15 HISTORY — DX: Depression, unspecified: F32.A

## 2015-07-15 HISTORY — DX: Pure hypercholesterolemia, unspecified: E78.00

## 2015-07-15 HISTORY — DX: Atrial septal defect: Q21.1

## 2015-07-15 HISTORY — PX: CARDIAC CATHETERIZATION: SHX172

## 2015-07-15 HISTORY — DX: Aneurysm of heart: I25.3

## 2015-07-15 HISTORY — DX: Patent foramen ovale: Q21.12

## 2015-07-15 HISTORY — PX: PATENT FORAMEN OVALE CLOSURE: SHX2181

## 2015-07-15 HISTORY — DX: Cluster headache syndrome, unspecified, not intractable: G44.009

## 2015-07-15 LAB — BASIC METABOLIC PANEL
Anion gap: 10 (ref 5–15)
BUN: 13 mg/dL (ref 6–20)
CO2: 25 mmol/L (ref 22–32)
Calcium: 9.2 mg/dL (ref 8.9–10.3)
Chloride: 100 mmol/L — ABNORMAL LOW (ref 101–111)
Creatinine, Ser: 0.87 mg/dL (ref 0.61–1.24)
GFR calc Af Amer: >60 mL/min (ref >60)
GFR calc non Af Amer: >60 mL/min (ref >60)
Glucose, Bld: 100 mg/dL — ABNORMAL HIGH (ref 65–99)
Potassium: 3.5 mmol/L (ref 3.5–5.1)
Sodium: 135 mmol/L (ref 135–145)

## 2015-07-15 LAB — CBC
HCT: 40.7 % (ref 39.0–52.0)
Hemoglobin: 13.9 g/dL (ref 13.0–17.0)
MCH: 28.2 pg (ref 26.0–34.0)
MCHC: 34.2 g/dL (ref 30.0–36.0)
MCV: 82.6 fL (ref 78.0–100.0)
Platelets: 176 10*3/uL (ref 150–400)
RBC: 4.93 MIL/uL (ref 4.22–5.81)
RDW: 13.1 % (ref 11.5–15.5)
WBC: 4.3 10*3/uL (ref 4.0–10.5)

## 2015-07-15 LAB — POCT ACTIVATED CLOTTING TIME
Activated Clotting Time: 209 seconds
Activated Clotting Time: 240 seconds
Activated Clotting Time: 193 seconds
Activated Clotting Time: 214 seconds
Activated Clotting Time: 178 seconds

## 2015-07-15 LAB — PROTIME-INR
INR: 1.03 (ref 0.00–1.49)
Prothrombin Time: 13.7 seconds (ref 11.6–15.2)

## 2015-07-15 SURGERY — ASD/VSD CLOSURE
Anesthesia: LOCAL

## 2015-07-15 MED ORDER — SODIUM CHLORIDE 0.9 % IJ SOLN: 3.0000 mL | INTRAMUSCULAR | Status: DC | PRN

## 2015-07-15 MED ORDER — ASPIRIN 81 MG PO CHEW
81.0000 mg | CHEWABLE_TABLET | ORAL | Status: DC
Start: 2015-07-15 — End: 2015-07-15

## 2015-07-15 MED ORDER — HEPARIN SODIUM (PORCINE) 1000 UNIT/ML IJ SOLN
INTRAMUSCULAR | Status: AC
  Filled 2015-07-15: qty 1

## 2015-07-15 MED ORDER — DIAZEPAM 5 MG PO TABS: 5.0000 mg | ORAL_TABLET | Freq: Three times a day (TID) | ORAL | Status: DC | PRN

## 2015-07-15 MED ORDER — MIDAZOLAM HCL 2 MG/2ML IJ SOLN
INTRAMUSCULAR | Status: AC
  Filled 2015-07-15: qty 2

## 2015-07-15 MED ORDER — LIDOCAINE HCL (PF) 1 % IJ SOLN
INTRAMUSCULAR | Status: AC
  Filled 2015-07-15: qty 30

## 2015-07-15 MED ORDER — SODIUM CHLORIDE 0.9 % IV SOLN: INTRAVENOUS | Status: AC

## 2015-07-15 MED ORDER — ACETAMINOPHEN 325 MG PO TABS: 650.0000 mg | ORAL_TABLET | ORAL | Status: DC | PRN

## 2015-07-15 MED ORDER — CEFAZOLIN SODIUM-DEXTROSE 2-3 GM-% IV SOLR
INTRAVENOUS | Status: DC | PRN
  Administered 2015-07-15: 2 g via INTRAVENOUS

## 2015-07-15 MED ORDER — HEPARIN (PORCINE) IN NACL 2-0.9 UNIT/ML-% IJ SOLN
INTRAMUSCULAR | Status: AC
  Filled 2015-07-15: qty 500

## 2015-07-15 MED ORDER — CEFAZOLIN SODIUM-DEXTROSE 2-3 GM-% IV SOLR
INTRAVENOUS | Status: AC
  Filled 2015-07-15: qty 50

## 2015-07-15 MED ORDER — HEPARIN (PORCINE) IN NACL 2-0.9 UNIT/ML-% IJ SOLN
INTRAMUSCULAR | Status: DC | PRN
  Administered 2015-07-15: 10:00:00

## 2015-07-15 MED ORDER — PAROXETINE HCL 20 MG PO TABS
10.0000 mg | ORAL_TABLET | Freq: Every day | ORAL | Status: DC
  Administered 2015-07-16: 10:00:00 10 mg via ORAL
  Filled 2015-07-15: qty 1

## 2015-07-15 MED ORDER — FENTANYL CITRATE (PF) 100 MCG/2ML IJ SOLN
INTRAMUSCULAR | Status: AC
  Filled 2015-07-15: qty 2

## 2015-07-15 MED ORDER — MIDAZOLAM HCL 2 MG/2ML IJ SOLN
INTRAMUSCULAR | Status: DC | PRN
  Administered 2015-07-15: 1 mg via INTRAVENOUS
  Administered 2015-07-15: 2 mg via INTRAVENOUS

## 2015-07-15 MED ORDER — SODIUM CHLORIDE 0.9 % IJ SOLN: 3.0000 mL | Freq: Two times a day (BID) | INTRAMUSCULAR | Status: DC

## 2015-07-15 MED ORDER — OXYCODONE-ACETAMINOPHEN 5-325 MG PO TABS: 1.0000 | ORAL_TABLET | ORAL | Status: DC | PRN

## 2015-07-15 MED ORDER — SODIUM CHLORIDE 0.9 % IV SOLN: 250.0000 mL | INTRAVENOUS | Status: DC | PRN

## 2015-07-15 MED ORDER — TRIAMTERENE-HCTZ 37.5-25 MG PO TABS
1.0000 | ORAL_TABLET | Freq: Every day | ORAL | Status: DC
  Administered 2015-07-16: 1 via ORAL
  Filled 2015-07-15 (×2): qty 1

## 2015-07-15 MED ORDER — SODIUM CHLORIDE 0.9 % IJ SOLN
3.0000 mL | Freq: Two times a day (BID) | INTRAMUSCULAR | Status: DC
  Administered 2015-07-16: 3 mL via INTRAVENOUS

## 2015-07-15 MED ORDER — CLOPIDOGREL BISULFATE 75 MG PO TABS
75.0000 mg | ORAL_TABLET | Freq: Every day | ORAL | Status: DC
  Administered 2015-07-16: 75 mg via ORAL
  Filled 2015-07-15: qty 1

## 2015-07-15 MED ORDER — SODIUM CHLORIDE 0.9 % WEIGHT BASED INFUSION
1.0000 mL/kg/h | INTRAVENOUS | Status: DC
  Administered 2015-07-15: 1 mL/kg/h via INTRAVENOUS

## 2015-07-15 MED ORDER — ATORVASTATIN CALCIUM 20 MG PO TABS
20.0000 mg | ORAL_TABLET | Freq: Every day | ORAL | Status: DC
  Administered 2015-07-16: 20 mg via ORAL
  Filled 2015-07-15: qty 1

## 2015-07-15 MED ORDER — ONDANSETRON HCL 4 MG/2ML IJ SOLN: 4.0000 mg | Freq: Four times a day (QID) | INTRAMUSCULAR | Status: DC | PRN

## 2015-07-15 MED ORDER — ASPIRIN EC 81 MG PO TBEC
81.0000 mg | DELAYED_RELEASE_TABLET | Freq: Every day | ORAL | Status: DC
  Administered 2015-07-16: 81 mg via ORAL
  Filled 2015-07-15: qty 1

## 2015-07-15 MED ORDER — CLOPIDOGREL BISULFATE 75 MG PO TABS: 75.0000 mg | ORAL_TABLET | ORAL | Status: DC

## 2015-07-15 MED ORDER — SODIUM CHLORIDE 0.9 % WEIGHT BASED INFUSION
3.0000 mL/kg/h | INTRAVENOUS | Status: DC
  Administered 2015-07-15: 3 mL/kg/h via INTRAVENOUS

## 2015-07-15 MED ORDER — FENTANYL CITRATE (PF) 100 MCG/2ML IJ SOLN
INTRAMUSCULAR | Status: DC | PRN
  Administered 2015-07-15: 25 ug via INTRAVENOUS
  Administered 2015-07-15: 50 ug via INTRAVENOUS

## 2015-07-15 MED ORDER — HEPARIN SODIUM (PORCINE) 1000 UNIT/ML IJ SOLN
INTRAMUSCULAR | Status: DC | PRN
  Administered 2015-07-15: 6000 [IU] via INTRAVENOUS
  Administered 2015-07-15: 3000 [IU] via INTRAVENOUS

## 2015-07-15 SURGICAL SUPPLY — 12 items
AMPLATZER PFO OCCLUDER 35 (Prosthesis & Implant Heart) ×2 IMPLANT
BALLN SIZING AMPLATZER 24 (BALLOONS) ×2
BALLOON SIZING AMPLATZER 24 (BALLOONS) ×1 IMPLANT
CATH ACUNAV 8FR 90CM (CATHETERS) ×2 IMPLANT
CATH SITESEER 5F MULTI A 2 (CATHETERS) ×2 IMPLANT
GUIDEWIRE AMPLATZER 1.5JX260 (WIRE) ×2 IMPLANT
OCCLUDER AMPLATZER PFO 35 (Prosthesis & Implant Heart) ×1 IMPLANT
PROTECTION STATION PRESSURIZED (MISCELLANEOUS) ×2
SHEATH PINNACLE 6F 10CM (SHEATH) ×2 IMPLANT
SHEATH PINNACLE 9F 10CM (SHEATH) ×4 IMPLANT
STATION PROTECTION PRESSURIZED (MISCELLANEOUS) ×1 IMPLANT
SYSTEM DELIVERY AMPLATZER 9FR (SHEATH) ×2 IMPLANT

## 2015-07-15 NOTE — Progress Notes (Addendum)
Site area: RFV x 2 Site Prior to Removal:  Level 0 Pressure Applied For:2535min Manual:   yes Patient Status During Pull:  stable Post Pull Site:  Level 0 Post Pull Instructions Given:  yes Post Pull Pulses Present: palpable Dressing Applied:  clear Bedrest begins @ 1300 till 1700 Comments: slightly hard area palpated above insertion sites pre removal

## 2015-07-15 NOTE — Interval H&P Note (Signed)
History and Physical Interval Note:  07/15/2015 8:44 AM  Troy [name]  has presented today for surgery, with the diagnosis of pfo  The various methods of treatment have been discussed with the patient and family. After consideration of risks, benefits and other options for treatment, the patient has consented to  Procedure(s): PFO Closure (N/A) as a surgical intervention .  The patient's history has been reviewed, patient examined, no change in status, stable for surgery.  I have reviewed the patient's chart and labs.  Questions were answered to the patient's satisfaction.    TEE reviewed. Pt with large PFO and atrial septal aneurysm. Procedure discussed again with patient and wife. All questions answered.   Tonny Bollmanooper, Marvelyn Bouchillon

## 2015-07-15 NOTE — H&P (View-Only) (Signed)
Cardiology Office Note Date:  06/16/2015   ID:  Troy [name], DOB 03-03-1956, MRN [id number]  PCP:  No PCP Per Patient  Cardiologist:  Tonny Bollmanooper, Nikitas Davtyan, MD    Chief Complaint  Patient presents with  . PFO Evaluation    History of Present Illness: Troy CoffinRobert Lueras is a 60 y.o. male who presents for PFO and cryptogenic stroke. He recently presented with aphasia and left arm weakness, resolving spontaneously after a brief period of time without indication for TPA or acute intervention. He was taking ASA at the time of this event. He reports no residual deficit at this time. The patient had an initial stroke in [place] when he presented with aphasia and right facial numbness after heavy lifting earlier this year. He has been treated with ASA and atorvastatin since that time. MRI of the brain showed small/punctate acute infarcts in the right MCA territory. At time of prior event in June he was found to have a 7/17 mm PFO with aneurysmal atrial septum. LE doppler showed no evidence of DVT. Medical therapy with ASA was recommended.   The patient is currently feeling well. He has no cardiopulmonary symptoms. Today, he denies symptoms of palpitations, chest pain, shortness of breath, orthopnea, PND, lower extremity edema, dizziness, or syncope.    Past Medical History  Diagnosis Date  . Stroke [facility])     Bleed in June 2016  . Hypertension   . TIA (transient ischemic attack)     Past Surgical History  Procedure Laterality Date  . Shoulder surgery Right   . Knee surgery Right     Current Outpatient Prescriptions  Medication Sig Dispense Refill  . aspirin EC 81 MG tablet Take 1 tablet (81 mg total) by mouth daily. 30 tablet 0  . atorvastatin (LIPITOR) 20 MG tablet Take 1 tablet by mouth daily.  7  . clopidogrel (PLAVIX) 75 MG tablet Take 1 tablet (75 mg total) by mouth daily. 30 tablet 0  . PARoxetine (PAXIL) 10 MG tablet Take 1 tablet by mouth daily.  0  .  triamterene-hydrochlorothiazide (MAXZIDE-25) 37.5-25 MG tablet Take 1 tablet by mouth daily.  0   No current facility-administered medications for this visit.    Allergies:   Review of patient's allergies indicates no known allergies.   Social History:  The patient  reports that he has never smoked. He does not have any smokeless tobacco history on file. He reports that he drinks about 1.2 oz of alcohol per week. He reports that he does not use illicit drugs.   Family History:  The patient's identical twin brother has had a stroke, reportedly hemorrhagic.     ROS:  Please see the history of present illness.  All other systems are reviewed and negative.    PHYSICAL EXAM: VS:  BP 132/88 mmHg  Pulse 66  Ht 6' (1.829 m)  Wt 208 lb 12.8 oz (94.711 kg)  BMI 28.31 kg/m2  SpO2 95% , BMI Body mass index is 28.31 kg/(m^2). GEN: Well nourished, well developed, in no acute distress HEENT: normal Neck: no JVD, no masses. No carotid bruits Cardiac: RRR without murmur or gallop                Respiratory:  clear to auscultation bilaterally, normal work of breathing GI: soft, nontender, nondistended, + BS MS: no deformity or atrophy Ext: no pretibial edema, pedal pulses 2+= bilaterally Skin: warm and dry, no rash Neuro:  Strength and sensation are intact Psych: euthymic mood, full affect  EKG:  EKG is not ordered today.  Recent Labs: 06/08/2015: ALT 42; Hemoglobin 15.3; Platelets 166 06/09/2015: BUN 10; Creatinine, Ser 0.99; Potassium 4.2; Sodium 140   Lipid Panel     Component Value Date/Time   CHOL 138 06/08/2015 1700   TRIG 52 06/08/2015 1700   HDL 60 06/08/2015 1700   CHOLHDL 2.3 06/08/2015 1700   VLDL 10 06/08/2015 1700   LDLCALC 68 06/08/2015 1700      Wt Readings from Last 3 Encounters:  06/14/15 208 lb 12.8 oz (94.711 kg)  06/08/15 200 lb (90.719 kg)     Cardiac Studies Reviewed: TEE as above - reported in discharge summary from [facility]  2D Echo  06-09-2015 Study Conclusions  - Left ventricle: The cavity size was normal. Wall thickness was normal. Systolic function was normal. The estimated ejection fraction was in the range of 55% to 60%. Wall motion was normal; there were no regional wall motion abnormalities. Doppler parameters are consistent with abnormal left ventricular relaxation (grade 1 diastolic dysfunction). - Aortic valve: There was no stenosis. - Aorta: Borderline dilated aortic root. Ascending aorta dimension: 4.0 cm. Aortic root dimension: 38 mm (ED). - Mitral valve: There was trivial regurgitation. - Left atrium: The atrium was mildly dilated. - Right ventricle: The cavity size was normal. Systolic function was normal. - Pulmonary arteries: No complete TR doppler jet so unable to estimate PA systolic pressure. - Inferior vena cava: The vessel was normal in size. The respirophasic diameter changes were in the normal range (= 50%), consistent with normal central venous pressure.  Impressions:  - Normal LV size with EF 55-60%. Normal RV size and systolic function. No significant valvular abnormalities. Mildly dilated aortic root/ascending aorta.  ASSESSMENT AND PLAN: PFO, recurrent cryptogenic stroke on medical therapy. We have reviewed common incidence of PFO in the general population, increased incidence in the cryptogenic stroke population, and potential mechanism of action of paradoxical embolus. This patient has been found to have a large PFO (11x17) described on outside TEE, an initial stroke occurring with heavy lifting, and now a recurrent stroke on medical therapy. We've reviewed randomized controlled trial data of transcatheter PFO closure versus medical therapy, and pros and cons of each approach. Considering above factors, transcatheter closure seems to be an appropriate treatment option for this patient and he would like to proceed.   I reviewed the risks, indications, and  alternatives to transcatheter closure with the patient. Specific risks include bleeding, infection, device embolization, stroke, cardiac perforation, tamponade, arrhythmia, MI, and late device erosion. He understands these serious risks occur at low incidence of < 1%. He understands the need for 6 months of dual antiplatelet therapy with aspirin and clopidogrel, as well as the need for SBE prophylaxis for 6 months.  Current medicines are reviewed with the patient today.  The patient does not have concerns regarding medicines.  Labs/ tests ordered today include:  No orders of the defined types were placed in this encounter.   Enzo BiSigned, Addison Freimuth, MD  06/16/2015 10:03 AM    [facility] [address], [place], [place]  [address] Phone: [phone](336) [phone]; Fax: [phone]) [phone]

## 2015-07-16 ENCOUNTER — Encounter (HOSPITAL_COMMUNITY): Payer: Self-pay | Admitting: Cardiovascular Disease

## 2015-07-16 ENCOUNTER — Ambulatory Visit (HOSPITAL_COMMUNITY): Payer: BC Managed Care – PPO

## 2015-07-16 ENCOUNTER — Other Ambulatory Visit: Payer: Self-pay | Admitting: Physician Assistant

## 2015-07-16 ENCOUNTER — Ambulatory Visit (HOSPITAL_BASED_OUTPATIENT_CLINIC_OR_DEPARTMENT_OTHER): Payer: BC Managed Care – PPO

## 2015-07-16 DIAGNOSIS — Q2112 Patent foramen ovale: Secondary | ICD-10-CM

## 2015-07-16 DIAGNOSIS — Q211 Atrial septal defect: Secondary | ICD-10-CM

## 2015-07-16 MED ORDER — CLOPIDOGREL BISULFATE 75 MG PO TABS: 75.0000 mg | ORAL_TABLET | Freq: Every day | ORAL | Status: DC

## 2015-07-16 NOTE — Progress Notes (Signed)
  Echocardiogram 2D Echocardiogram has been performed.  Troy [name] 07/16/2015, 9:20 AM

## 2015-07-16 NOTE — Discharge Summary (Signed)
Discharge Summary    Patient ID: Troy [name],  MRN: [id number], DOB/AGE: 01/21/1956 60 y.o.  Admit date: 07/15/2015 Discharge date: 07/16/2015  Primary Care Provider: Pcp Not In System Primary Cardiologist: Dr. Excell Seltzer  Discharge Diagnoses    Principal Problem:   PFO with atrial septal aneurysm Active Problems:   TIA (transient ischemic attack)   Hypertension   Allergies No Known Allergies  Diagnostic Studies/Procedures    PFO closure device placement 07/15/2015 by Dr. Excell Seltzer Conclusion    Successful transcatheter PFO closure with a 35 mm Amplatzer PFO device. Slightly positive agitated saline study at the completion of the procedure is within expected limits and would anticipate complete closure after full endotheliazation in 6 months.   Echo 07/16/2015 LV EF: 55% -  60%  ------------------------------------------------------------------- Indications:   Patent foramen ovale 745.5.  ------------------------------------------------------------------- History:  PMH:  Transient ischemic attack. Stroke.  ------------------------------------------------------------------- Study Conclusions  - Left ventricle: The cavity size was normal. Wall thickness was increased in a pattern of severe LVH. Systolic function was normal. The estimated ejection fraction was in the range of 55% to 60%. - Left atrium: The atrium was mildly dilated. - Atrial septum: Post PFO closure with amplatzer type device. No residual PFO/shunting seen can consider bubble study to further evaluate but device looks to be in good position.   _____________   History of Present Illness     Troy [name] is a 60 y.o. male who presents for PFO and cryptogenic stroke. He recently presented with aphasia and left arm weakness, resolving spontaneously after a brief period of time without indication for TPA or acute intervention. He was taking ASA at the time of this event. He reports no  residual deficit at this time. The patient had an initial stroke in [place] when he presented with aphasia and right facial numbness after heavy lifting earlier this year. He has been treated with ASA and atorvastatin since that time. MRI of the brain showed small/punctate acute infarcts in the right MCA territory. At time of prior event in June he was found to have a 7/17 mm PFO with aneurysmal atrial septum. LE doppler showed no evidence of DVT. Medical therapy with ASA was recommended.    Hospital Course     She has been seen by Dr. Excell Seltzer in the office on 06/16/2015 given the presence of large PFO described on outside TEE, recurrent cryptogenic stroke on medical therapy, Dr. Excell Seltzer recommended transcatheter closure, different options has been explained along with risk and benefit of the procedure, patient displayed clear understanding and agreed to proceed. He presented to [facility] on 07/15/2015 for the planned procedure. His he underwent successful transcatheter PFO with 35 mm Amplatzer PFO device. He did have slightly positive agitated saline study at the completion of the procedure which is within expected limits and would anticipate complete closure after full endothelialization in 6 months.  He was seen in the morning of 07/16/2015, at which time he was doing well after PFO closure. Chest x-ray was reviewed. Groin site is stable. He is deemed stable for discharge. Prior to discharge, a limited echo was obtained which showed stable PFO closure device position. I have sent staff message to Dr. Earmon Phoenix nurse will contact the patient to arrange 30 day follow-up with Dr. Excell Seltzer and limited echo on the same day. I have instructed the patient on no heavy lifting for 30 days, continue normal activity. He will need SBE prophylaxis for at least 6 months. I have  given him six-month supply of Plavix. He will need DAPT for 6 months after PFO closure.  _____________  Discharge Vitals Blood pressure  131/93, pulse 63, temperature 97.6 F (36.4 C), temperature source Oral, resp. rate 17, height 6' (1.829 m), weight 211 lb 12.8 oz (96.072 kg), SpO2 94 %.  Filed Weights   07/15/15 0700 07/15/15 0739 07/16/15 0540  Weight: 208 lb 12.4 oz (94.7 kg) 208 lb (94.348 kg) 211 lb 12.8 oz (96.072 kg)    Labs & Radiologic Studies     CBC  Recent Labs  07/15/15 0754  WBC 4.3  HGB 13.9  HCT 40.7  MCV 82.6  PLT 176   Basic Metabolic Panel  Recent Labs  07/15/15 0754  NA 135  K 3.5  CL 100*  CO2 25  GLUCOSE 100*  BUN 13  CREATININE 0.87  CALCIUM 9.2    Dg Chest 2 View  07/16/2015  CLINICAL DATA:  Status post PFO closure. EXAM: CHEST  2 VIEW COMPARISON:  Chest radiograph 06/08/2015. FINDINGS: Monitoring leads overlie the patient. Interval placement closure device. Stable cardiac and mediastinal contours. No consolidative pulmonary opacities. No pleural effusion or pneumothorax. Postsurgical change proximal right humerus. Thoracic spine degenerative changes. IMPRESSION: No active cardiopulmonary disease. Electronically Signed   By: Annia Belt M.D.   On: 07/16/2015 08:17    Disposition   Pt is being discharged home today in good condition.  Follow-up Plans & Appointments    Follow-up Information    Follow up with Tonny Bollman, MD.   Specialty:  Cardiology   Why:  Dr. Earmon Phoenix nurse will contact you to arrange 30 day followup and limited echo on the same day at least 1 hour before your followup with Dr. Excell Seltzer, please give [place] a call if you do not hear from [place] in 2 business days   Contact information:   [address] [place] [address] [phone]      Discharge Instructions    Diet - low sodium heart healthy    Complete by:  As directed      Discharge instructions    Complete by:  As directed   Please contact cardiology if has significant shortness of breath, chest pain or significant dizziness. Please see your primary care provider as soon as  possible     Increase activity slowly    Complete by:  As directed            Discharge Medications   Discharge Medication List as of 07/16/2015 12:51 PM    CONTINUE these medications which have CHANGED   Details  clopidogrel (PLAVIX) 75 MG tablet Take 1 tablet (75 mg total) by mouth daily., Starting 07/16/2015, Until Discontinued, Print      CONTINUE these medications which have NOT CHANGED   Details  aspirin EC 81 MG tablet Take 1 tablet (81 mg total) by mouth daily., Starting 06/10/2015, Until Discontinued, Normal    atorvastatin (LIPITOR) 20 MG tablet Take 20 mg by mouth daily. , Starting 04/04/2015, Until Discontinued, Historical Med    PARoxetine (PAXIL) 10 MG tablet Take 10 mg by mouth daily. , Starting 04/04/2015, Until Discontinued, Historical Med    triamterene-hydrochlorothiazide (MAXZIDE-25) 37.5-25 MG tablet Take 1 tablet by mouth daily., Starting 04/15/2015, Until Discontinued, Historical Med         Aspirin prescribed at discharge?  Yes High Intensity Statin Prescribed? (Lipitor 40-80mg  or Crestor 20-40mg ): No: on low dose lipitor Beta Blocker Prescribed? No, no h/o CAD For  EF 45% or less, Was ACEI/ARB Prescribed? No: normal EF ADP Receptor Inhibitor Prescribed? (i.e. Plavix etc.-Includes Medically Managed Patients): Yes For EF <40%, Aldosterone Inhibitor Prescribed? No: not assessed Was EF assessed during THIS hospitalization? No: admitted for PFO closure only Was Cardiac Rehab II ordered? (Included Medically managed Patients): No: not ACS admission   Outstanding Labs/Studies   Outpatient limited 30 day echo on the same day of followup  Duration of Discharge Encounter   Greater than 30 minutes including physician time.  Signed, Azalee Course PA-C 07/16/2015, 2:56 PM

## 2015-07-16 NOTE — Progress Notes (Signed)
    Subjective:  Feels well. No CP, groin pain, or dyspnea.  Objective:  Vital Signs in the last 24 hours: Temp:  [97.6 F (36.4 C)-97.9 F (36.6 C)] 97.9 F (36.6 C) (01/17 0744) Pulse Rate:  [48-75] 69 (01/17 0744) Resp:  [5-23] 22 (01/17 0744) BP: (118-152)/(82-106) 136/88 mmHg (01/17 0744) SpO2:  [95 %-100 %] 97 % (01/17 0744) Weight:  [211 lb 12.8 oz (96.072 kg)] 211 lb 12.8 oz (96.072 kg) (01/17 0540)  Intake/Output from previous day: 01/16 0701 - 01/17 0700 In: 240 [P.O.:240] Out: 850 [Urine:850]  Physical Exam: Pt is alert and oriented, NAD HEENT: normal Neck: JVP - normal Lungs: CTA bilaterally CV: RRR without murmur or gallop Abd: soft, NT, Positive BS, no hepatomegaly Ext: no C/C/E, distal pulses intact and equal, right groin clear Skin: warm/dry no rash   Lab Results:  Recent Labs  07/15/15 0754  WBC 4.3  HGB 13.9  PLT 176    Recent Labs  07/15/15 0754  NA 135  K 3.5  CL 100*  CO2 25  GLUCOSE 100*  BUN 13  CREATININE 0.87   No results for input(s): TROPONINI in the last 72 hours.  Invalid input(s): CK, MB  Cardiac Studies: *CXR: personally reviewed. Report pending. PFO device in appropriate position  Tele: Sinus rhythm  Assessment/Plan:  PFO with atrial septal aneurysm: s/p PFO closure, doing well without complication. CXR reviewed. Groin site stable. Await limited echo, then ok to discharge home. For follow-up, will need:  30 day limited echo and office visit same day (my nurse will arrange)  ASA and plavix x 6 months  No heavy lifting x 30 days, otherwise ok for all normal activities, light exercise after normal post-femoral access restrictions  SBE prophylaxis x 6 months  Tonny Bollman, M.D. 07/16/2015, 8:14 AM

## 2015-07-16 NOTE — Discharge Instructions (Signed)
·   No heavy lifting x 30 days, otherwise ok for all normal activities, light exercise after normal post-femoral access restrictions    SBE prophylaxis x 6 months (need antibiotic for any dental procedure to prevent endocarditis)

## 2015-07-30 ENCOUNTER — Telehealth: Payer: Self-pay | Admitting: Cardiovascular Disease

## 2015-07-30 NOTE — Telephone Encounter (Signed)
I spoke with the pt and he remembers the nurses constantly checking for a hematoma after his procedure and he has also continued to monitor his groin area.  The pt said he has a small knot in the right groin area at his insertion site (size of a cherry). The pt does not have any pain or drainage at site and his bruises are healing.  At this time the pt will continue with observation of groin site and contact the office with any changes in size of knot.  Pt agreed with plan.

## 2015-07-30 NOTE — Telephone Encounter (Signed)
Please call,question about his recovery.

## 2015-07-30 NOTE — Telephone Encounter (Signed)
agree

## 2015-08-16 ENCOUNTER — Other Ambulatory Visit (HOSPITAL_COMMUNITY): Payer: BC Managed Care – PPO

## 2015-08-26 ENCOUNTER — Other Ambulatory Visit: Payer: Self-pay

## 2015-08-26 ENCOUNTER — Ambulatory Visit (HOSPITAL_COMMUNITY): Payer: BC Managed Care – PPO | Attending: Internal Medicine

## 2015-08-26 DIAGNOSIS — Q211 Atrial septal defect: Secondary | ICD-10-CM

## 2015-08-26 DIAGNOSIS — Z9889 Other specified postprocedural states: Secondary | ICD-10-CM | POA: Insufficient documentation

## 2015-08-26 DIAGNOSIS — I517 Cardiomegaly: Secondary | ICD-10-CM | POA: Diagnosis not present

## 2015-08-26 DIAGNOSIS — Q2112 Patent foramen ovale: Secondary | ICD-10-CM

## 2015-08-28 ENCOUNTER — Encounter: Payer: Self-pay | Admitting: Cardiovascular Disease

## 2015-08-28 ENCOUNTER — Ambulatory Visit (INDEPENDENT_AMBULATORY_CARE_PROVIDER_SITE_OTHER): Payer: BC Managed Care – PPO | Admitting: Cardiovascular Disease

## 2015-08-28 VITALS — BP 120/82 | HR 63 | Ht 72.0 in | Wt 212.4 lb

## 2015-08-28 DIAGNOSIS — Q211 Atrial septal defect: Secondary | ICD-10-CM

## 2015-08-28 DIAGNOSIS — Q2112 Patent foramen ovale: Secondary | ICD-10-CM

## 2015-08-28 MED ORDER — AMOXICILLIN 500 MG PO TABS: ORAL_TABLET | ORAL | Status: DC

## 2015-08-28 NOTE — Progress Notes (Signed)
Cardiology Office Note Date:  08/28/2015   ID:  Troy [name], DOB 1956/05/15, MRN [id number]  PCP:  Pcp Not In System  Cardiologist:  Tonny Bollman, MD    Chief Complaint  Patient presents with  . Follow-up    PFO Closure    History of Present Illness: Troy [name] is a 60 y.o. male who presents for follow-up after undergoing transcatheter PFO closure July 15, 2015. The patient had previously experienced 2 TIA's, with most recent MRI showing small punctate acute infarcts in the right MCA territory. He was found to have a large PFO and underwent closure with a 35 mm PFO occluder. The procedure was uncomplicated with the exception of post-procedural bruising. The patient has no complaints today. Today, he denies symptoms of palpitations, chest pain, shortness of breath, orthopnea, PND, lower extremity edema, dizziness, or syncope.  Past Medical History  Diagnosis Date  . Hypertension   . PFO with atrial septal aneurysm 07/15/2015    s/p 35 mm Amplatzer PFO device by Dr. Excell Seltzer 07/15/2015  . Hypercholesterolemia   . Cluster headaches     "q couple years" (07/15/2015)  . Stroke (HCC) 11/2014; 05/2015    denies residual from either on 07/15/2015  . Depression     "mood stabilizer"    Past Surgical History  Procedure Laterality Date  . Shoulder open rotator cuff repair Right 1990  . Knee arthroscopy Right 2013    "meniscus repair"  . Patent foramen ovale closure  07/15/2015  . Cardiac catheterization N/A 07/15/2015    Procedure: PFO Closure;  Surgeon: Tonny Bollman, MD;  Location: [facility] INVASIVE CV LAB;  Service: Cardiovascular;  Laterality: N/A;    Current Outpatient Prescriptions  Medication Sig Dispense Refill  . aspirin EC 81 MG tablet Take 1 tablet (81 mg total) by mouth daily. 30 tablet 0  . atorvastatin (LIPITOR) 20 MG tablet Take 20 mg by mouth daily.   7  . clopidogrel (PLAVIX) 75 MG tablet Take 1 tablet (75 mg total) by mouth daily. 180 tablet 0  . PARoxetine (PAXIL)  10 MG tablet Take 10 mg by mouth daily.   0  . triamterene-hydrochlorothiazide (MAXZIDE-25) 37.5-25 MG tablet Take 1 tablet by mouth daily.  0  . amoxicillin (AMOXIL) 500 MG tablet Take 4 tablets by mouth one hour prior to dental appointment 8 tablet 2   No current facility-administered medications for this visit.    Allergies:   Review of patient's allergies indicates no known allergies.   Social History:  The patient  reports that he has never smoked. He has never used smokeless tobacco. He reports that he drinks about 3.0 oz of alcohol per week. He reports that he does not use illicit drugs.   Family History:  The patient's  family history is not on file.    ROS:  Please see the history of present illness.  Otherwise, review of systems is positive for easy bruising.  All other systems are reviewed and negative.    PHYSICAL EXAM: VS:  BP 120/82 mmHg  Pulse 63  Ht 6' (1.829 m)  Wt 96.344 kg (212 lb 6.4 oz)  BMI 28.80 kg/m2 , BMI Body mass index is 28.8 kg/(m^2). GEN: Well nourished, well developed, in no acute distress HEENT: normal Neck: no JVD, no masses. No carotid bruits Cardiac: RRR without murmur or gallop                Respiratory:  clear to auscultation bilaterally, normal work of breathing GI:  soft, nontender, nondistended, + BS MS: no deformity or atrophy Ext: no pretibial edema, pedal pulses 2+= bilaterally Skin: warm and dry, no rash Neuro:  Strength and sensation are intact Psych: euthymic mood, full affect  EKG:  EKG is ordered today. The ekg ordered today shows NSR 63 bpm, first degree AV block, otherwise within normal limits  Recent Labs: 06/08/2015: ALT 42 07/15/2015: BUN 13; Creatinine, Ser 0.87; Hemoglobin 13.9; Platelets 176; Potassium 3.5; Sodium 135   Lipid Panel     Component Value Date/Time   CHOL 138 06/08/2015 1700   TRIG 52 06/08/2015 1700   HDL 60 06/08/2015 1700   CHOLHDL 2.3 06/08/2015 1700   VLDL 10 06/08/2015 1700   LDLCALC 68  06/08/2015 1700      Wt Readings from Last 3 Encounters:  08/28/15 96.344 kg (212 lb 6.4 oz)  07/16/15 96.072 kg (211 lb 12.8 oz)  06/14/15 94.711 kg (208 lb 12.8 oz)     Cardiac Studies Reviewed: Echo 08/25/14: Study Conclusions  - Left ventricle: The cavity size was normal. Wall thickness was increased in a pattern of mild LVH. Systolic function was normal. The estimated ejection fraction was in the range of 55% to 60%. - Atrial septum: PFO closure device is well seated No obvious interatrial shunt.  Cath 07/15/2015: Procedures    PFO Closure    Conclusion    Successful transcatheter PFO closure with a 35 mm Amplatzer PFO device. Slightly positive agitated saline study at the completion of the procedure is within expected limits and would anticipate complete closure after full endotheliazation in 6 months.    Indications    PFO with atrial septal aneurysm [Q21.1 (ICD-10-CM)]    Technique and Indications    INDICATION: PFO with atrial septal aneurysm and recurrent stroke/TIA  PROCEDURAL DETAILS:  The right groin was prepped, draped, and anesthetized with 1% lidocaine. Using modified Seldinger technique, a 6 French sheath was introduced into the right femoral vein and a 9 Fr sheath is placed in the ipsilateral right femoral vein. Heparin is administered for anticoagulation. A therapeutic ACT is achieved. Intracardiac echo is performed with an 8 Fr Accunav catheter. This confirms a large PFO with right-to-left shunting at baseline. A multipurpose catheter is then used to cross the PFO. An Amplatzer extra stiff J-tipped guidewire was advanced into the left upper pulmonary vein. Because of the large size of the PFO, balloon sizing is performed. Balloon stop flow occurred at 14 mm based on angiographic an intracardiac echo measurements. Because of PFO morphology, I elected to treat the defect with a 35 mm Amplatzer PFO occluder. The devices prepped in sterile saline per  protocol. A 9 French TorqVue sheath is advanced into the left atrium and the device is deployed under fluoroscopic and intracardiac echo imaging. Extensive imaging is performed and the device is in proper position and orientation. The devices released using normal technique. The long delivery sheath was changed out for a short 9 [place] sheath. Agitated saline study is performed. There is slightly positive agitated saline study with right-to-left flow. There were no immediate procedural complications. The patient was transferred to the post catheterization recovery area for further monitoring.  Estimated blood loss <50 mL. There were no immediate complications during the procedure.     PFO/ASD There is a PFO. There is an atrial septal aneurysm present. Agitated saline study indicates right-to-left shunt at rest. Balloon sizing was performed. PFO defect size: 14mm. Intracardiac echo used for procedural guidance. 35 mm Amplatzer PFO Occluder  was used to close the defect. Agitated saline study indicates residual right-to-left shunt post closure. There were no immediate procedural complications.   ASSESSMENT AND PLAN: PFO/Cryptogenic stroke: s/p device closure. Doing well. Repeat 2D Echo in 6 months. I reviewed his recent echo images with him today. He will take SBE prophylaxis x 6 months as needed and continue ASA and plavix until he follows up.   Current medicines are reviewed with the patient today.  The patient does not have concerns regarding medicines.  Labs/ tests ordered today include:   Orders Placed This Encounter  Procedures  . EKG 12-Lead  . ECHO BUBBLE STUDY    Disposition:   FU 6 months with a limited echo and bubble study  Signed, Tonny Bollman, MD  08/28/2015 5:17 PM    [facility] [address] [place], [place], [place]  [address] Phone: [phone]; Fax: [phone]

## 2015-08-28 NOTE — Patient Instructions (Signed)
Medication Instructions:  Your physician recommends that you continue on your current medications as directed. Please refer to the Current Medication list given to you today.  Labwork: No new orders.   Testing/Procedures: Your physician has requested that you have a Limited echocardiogram with Bubble Study in August (one week prior to appointment with Dr Excell Seltzer). Echocardiography is a painless test that uses sound waves to create images of your heart. It provides your doctor with information about the size and shape of your heart and how well your heart's chambers and valves are working. This procedure takes approximately one hour. There are no restrictions for this procedure.  Follow-Up: Your physician wants you to follow-up in: August with Dr Excell Seltzer.  You will receive a reminder letter in the mail two months in advance. If you don't receive a letter, please call our office to schedule the follow-up appointment.   Any Other Special Instructions Will Be Listed Below (If Applicable).     If you need a refill on your cardiac medications before your next appointment, please call your pharmacy.

## 2015-09-23 ENCOUNTER — Telehealth: Payer: Self-pay | Admitting: Cardiovascular Disease

## 2015-09-23 NOTE — Telephone Encounter (Signed)
I spoke with the pt and made him aware that he is okay to take a zpak.

## 2015-09-23 NOTE — Telephone Encounter (Signed)
Pt thinks he may have bronchitis was put on zpack, wife wanted him to call and make sure that's ok to take-pls call

## 2016-02-18 ENCOUNTER — Encounter: Payer: Self-pay | Admitting: Cardiovascular Disease

## 2016-02-19 ENCOUNTER — Telehealth: Payer: Self-pay | Admitting: Cardiovascular Disease

## 2016-02-19 NOTE — Telephone Encounter (Signed)
New message       Pt c/o medication issue:  1. Name of Medication: plavix 2. How are you currently taking this medication (dosage and times per day)? 75mg  daily 3. Are you having a reaction (difficulty breathing--STAT)? no 4. What is your medication issue? Pt states his neurologist prescribed naproxen 500mg  for his migraine headaches.  Can he take this with his plavix?

## 2016-02-19 NOTE — Telephone Encounter (Signed)
Dr Excell Seltzerooper has responded to this pt through My Chart but the pt has not read the message at this time.  Troy BollmanMichael Cooper, MD  to [name]       02/18/16 11:03 PM  [name] - you're far enough out from PFO closure that it should be fine for you to stop taking plavix and take naproxen as needed for headaches. Look forward to see you soon.   Kathlene NovemberMike    This MyChart message has not been read   I left the pt a detailed message including Dr Earmon Phoenixooper's exact My chart response.

## 2016-02-24 ENCOUNTER — Other Ambulatory Visit: Payer: Self-pay

## 2016-02-24 ENCOUNTER — Ambulatory Visit (HOSPITAL_COMMUNITY): Payer: BC Managed Care – PPO | Attending: Cardiology

## 2016-02-24 DIAGNOSIS — I119 Hypertensive heart disease without heart failure: Secondary | ICD-10-CM | POA: Insufficient documentation

## 2016-02-24 DIAGNOSIS — Q2112 Patent foramen ovale: Secondary | ICD-10-CM

## 2016-02-24 DIAGNOSIS — Q211 Atrial septal defect: Secondary | ICD-10-CM | POA: Insufficient documentation

## 2016-02-24 DIAGNOSIS — I77810 Thoracic aortic ectasia: Secondary | ICD-10-CM | POA: Diagnosis not present

## 2016-02-24 DIAGNOSIS — Z8673 Personal history of transient ischemic attack (TIA), and cerebral infarction without residual deficits: Secondary | ICD-10-CM | POA: Diagnosis not present

## 2016-02-24 DIAGNOSIS — E785 Hyperlipidemia, unspecified: Secondary | ICD-10-CM | POA: Diagnosis not present

## 2016-02-26 ENCOUNTER — Encounter: Payer: Self-pay | Admitting: Cardiovascular Disease

## 2016-02-27 ENCOUNTER — Other Ambulatory Visit: Payer: Self-pay

## 2016-02-27 DIAGNOSIS — R519 Headache, unspecified: Secondary | ICD-10-CM

## 2016-02-27 DIAGNOSIS — R51 Headache: Principal | ICD-10-CM

## 2016-03-04 ENCOUNTER — Encounter: Payer: Self-pay | Admitting: Cardiovascular Disease

## 2016-03-06 ENCOUNTER — Encounter: Payer: Self-pay | Admitting: Cardiovascular Disease

## 2016-03-06 ENCOUNTER — Ambulatory Visit (INDEPENDENT_AMBULATORY_CARE_PROVIDER_SITE_OTHER): Payer: BC Managed Care – PPO | Admitting: Cardiovascular Disease

## 2016-03-06 VITALS — BP 118/70 | HR 62 | Ht 72.0 in | Wt 210.0 lb

## 2016-03-06 DIAGNOSIS — Q211 Atrial septal defect: Secondary | ICD-10-CM

## 2016-03-06 DIAGNOSIS — Q2112 Patent foramen ovale: Secondary | ICD-10-CM

## 2016-03-06 MED ORDER — AMOXICILLIN 500 MG PO TABS: ORAL_TABLET | ORAL | 2 refills | Status: DC

## 2016-03-06 NOTE — Patient Instructions (Signed)
Medication Instructions:  Your physician recommends that you continue on your current medications as directed. Please refer to the Current Medication list given to you today.   Labwork: none  Testing/Procedures: Your physician has requested that you have an echocardiogram. Echocardiography is a painless test that uses sound waves to create images of your heart. It provides your doctor with information about the size and shape of your heart and how well your heart's chambers and valves are working. This procedure takes approximately one hour. There are no restrictions for this procedure. To be done in 12 months--week or 2 prior to appt with Dr.Cooper    Follow-Up: Your physician wants you to follow-up in: 12 months.  You will receive a reminder letter in the mail two months in advance. If you don't receive a letter, please call our office to schedule the follow-up appointment.   Any Other Special Instructions Will Be Listed Below (If Applicable).     If you need a refill on your cardiac medications before your next appointment, please call your pharmacy.

## 2016-03-06 NOTE — Progress Notes (Signed)
Cardiology Office Note Date:  03/06/2016   ID:  Troy [name], DOB 21-Aug-1955, MRN [id number]  PCP:  Pcp Not In System  Cardiologist:  Tonny Bollman, MD    Chief Complaint  Patient presents with  . Patent Foramen Ovale     History of Present Illness: Troy [name] is a 60 y.o. male who presents for Follow-up after undergoing transcatheter PFO closure in January 2017. The patient had a history of recurrent TIAs and MRI evidence of punctate infarcts in the right MCA territory. He was found to have a large PFO and underwent closure with a 35 mm PFO occluder. He had an uncomplicated post procedural course.  The patient is doing well. He has discontinued Plavix. Still complaining of bruising easily. No chest pain, shortness of breath, or heart palpitations. He's had problems cluster headaches. He is scheduled to see Dr. Lucia Gaskins in the near future for neurologic evaluation.  Past Medical History:  Diagnosis Date  . Cluster headaches    "q couple years" (07/15/2015)  . Depression    "mood stabilizer"  . Hypercholesterolemia   . Hypertension   . PFO with atrial septal aneurysm 07/15/2015   s/p 35 mm Amplatzer PFO device by Dr. Excell Seltzer 07/15/2015  . Stroke [facility]) 11/2014; 05/2015   denies residual from either on 07/15/2015    Past Surgical History:  Procedure Laterality Date  . CARDIAC CATHETERIZATION N/A 07/15/2015   Procedure: PFO Closure;  Surgeon: Tonny Bollman, MD;  Location: [facility] INVASIVE CV LAB;  Service: Cardiovascular;  Laterality: N/A;  . KNEE ARTHROSCOPY Right 2013   "meniscus repair"  . PATENT FORAMEN OVALE CLOSURE  07/15/2015  . SHOULDER OPEN ROTATOR CUFF REPAIR Right 1990    Current Outpatient Prescriptions  Medication Sig Dispense Refill  . amoxicillin (AMOXIL) 500 MG tablet Take 4 tablets by mouth one hour prior to dental appointment 8 tablet 2  . aspirin EC 81 MG tablet Take 1 tablet (81 mg total) by mouth daily. 30 tablet 0  . atorvastatin (LIPITOR) 20 MG tablet Take  20 mg by mouth daily.   7  . PARoxetine (PAXIL) 10 MG tablet Take 10 mg by mouth daily.   0  . triamterene-hydrochlorothiazide (MAXZIDE-25) 37.5-25 MG tablet Take 1 tablet by mouth daily.  0   No current facility-administered medications for this visit.     Allergies:   Review of patient's allergies indicates no known allergies.   Social History:  The patient  reports that he has never smoked. He has never used smokeless tobacco. He reports that he drinks about 3.0 oz of alcohol per week . He reports that he does not use drugs.   Family History:  The patient's  family history is not on file.    ROS:  Please see the history of present illness.  Otherwise, review of systems is positive for headaches.  All other systems are reviewed and negative.    PHYSICAL EXAM: VS:  BP 118/70   Pulse 62   Ht 6' (1.829 m)   Wt 95.3 kg (210 lb)   BMI 28.48 kg/m  , BMI Body mass index is 28.48 kg/m. GEN: Well nourished, well developed, in no acute distress  HEENT: normal  Neck: no JVD, no masses. No carotid bruits Cardiac: RRR without murmur or gallop                Respiratory:  clear to auscultation bilaterally, normal work of breathing GI: soft, nontender, nondistended, + BS MS: no deformity or atrophy  Ext: no pretibial edema, pedal pulses 2+= bilaterally Skin: warm and dry, no rash Neuro:  Strength and sensation are intact Psych: euthymic mood, full affect  EKG:  EKG is ordered today. The ekg ordered today shows normal sinus rhythm with first-degree AV block, heart rate 62 bpm.  Recent Labs: 06/08/2015: ALT 42 07/15/2015: BUN 13; Creatinine, Ser 0.87; Hemoglobin 13.9; Platelets 176; Potassium 3.5; Sodium 135   Lipid Panel     Component Value Date/Time   CHOL 138 06/08/2015 1700   TRIG 52 06/08/2015 1700   HDL 60 06/08/2015 1700   CHOLHDL 2.3 06/08/2015 1700   VLDL 10 06/08/2015 1700   LDLCALC 68 06/08/2015 1700      Wt Readings from Last 3 Encounters:  03/06/16 95.3 kg (210  lb)  08/28/15 96.3 kg (212 lb 6.4 oz)  07/16/15 96.1 kg (211 lb 12.8 oz)     Cardiac Studies Reviewed: Echo: Study Conclusions  - Left ventricle: The cavity size was normal. There was mild   concentric hypertrophy. Systolic function was normal. The   estimated ejection fraction was in the range of 60% to 65%. Wall   motion was normal; there were no regional wall motion   abnormalities. The study is not technically sufficient to allow   evaluation of LV diastolic function. - Aorta: Aortic root dimension: 40 mm (ED). - Ascending aorta: The ascending aorta was mildly dilated. - Left atrium: The atrium was mildly dilated. - Atrial septum: A closure device was present. There was no atrial   level shunt.  ASSESSMENT AND PLAN: PFO with atrial septal aneurysm, status post transcatheter closure: No evidence of residual shunt by echo with bubble study. The patient is discontinued clopidogrel. He should remain on long-term low-dose aspirin. Secondary risk reduction with atorvastatin. We'll see him back in one year with an echocardiogram. Shouldn't need any further imaging after that follow-up visit. Advised he no longer needs to follow SBE prophylaxis as he is greater than 6 months out from closure.  Current medicines are reviewed with the patient today.  The patient does not have concerns regarding medicines.  Labs/ tests ordered today include:   Orders Placed This Encounter  Procedures  . EKG 12-Lead  . ECHOCARDIOGRAM COMPLETE    Disposition:   FU one year with an echo prior to the visit  Signed, Tonny Bollmanooper, Primrose Oler, MD  03/06/2016 9:59 AM    [facility] [address], [place], [place]  [address] Phone: [phone]; Fax: [phone]

## 2016-03-11 ENCOUNTER — Encounter: Payer: Self-pay | Admitting: Neurology

## 2016-03-11 ENCOUNTER — Ambulatory Visit (INDEPENDENT_AMBULATORY_CARE_PROVIDER_SITE_OTHER): Payer: BC Managed Care – PPO | Admitting: Neurology

## 2016-03-11 VITALS — BP 122/85 | HR 64 | Ht 72.0 in | Wt 211.0 lb

## 2016-03-11 DIAGNOSIS — G44019 Episodic cluster headache, not intractable: Secondary | ICD-10-CM

## 2016-03-11 DIAGNOSIS — G44009 Cluster headache syndrome, unspecified, not intractable: Secondary | ICD-10-CM | POA: Insufficient documentation

## 2016-03-11 MED ORDER — METHYLPREDNISOLONE 4 MG PO TBPK: ORAL_TABLET | ORAL | 2 refills | Status: DC

## 2016-03-11 NOTE — Patient Instructions (Addendum)
Remember to drink plenty of fluid, eat healthy meals and do not skip any meals. Try to eat protein with a every meal and eat a healthy snack such as fruit or nuts in between meals. Try to keep a regular sleep-wake schedule and try to exercise daily, particularly in the form of walking, 20-30 minutes a day, if you can.   As far as your medications are concerned, I would like to suggest: At onset of headache can use lidocaine drip or spray in the nostril affected every hour.  Discussed oxygen therapy and Verapamil Can also take Medrol Dosepak (steroid take 3-7 days to work)  As far as diagnostic testing: None needed today  I would like to see you back as needed, sooner if we need to. Please call us with any interim questions, concerns, problems, updates or refill requests.   Our phone number is [phone]. We also have an after hours call service for urgent matters and there is a physician on-call for urgent questions. For any emergencies you know to call 911 or go to the nearest emergency room  Methylprednisolone tablets What is this medicine? METHYLPREDNISOLONE (meth ill pred NISS oh lone) is a corticosteroid. It is commonly used to treat inflammation of the skin, joints, lungs, and other organs. Common conditions treated include asthma, allergies, and arthritis. It is also used for other conditions, such as blood disorders and diseases of the adrenal glands. This medicine may be used for other purposes; ask your health care provider or pharmacist if you have questions. What should I tell my health care provider before I take this medicine? They need to know if you have any of these conditions: -Cushing's syndrome -diabetes -glaucoma -heart problems or disease -high blood pressure -infection such as herpes, measles, tuberculosis, or chickenpox -kidney disease -liver disease -mental problems -myasthenia gravis -osteoporosis -seizures -stomach ulcer or intestine disease including  colitis and diverticulitis -thyroid problem -an unusual or allergic reaction to lactose, methylprednisolone, other medicines, foods, dyes, or preservatives -pregnant or trying to get pregnant -breast-feeding How should I use this medicine? Take this medicine by mouth with a drink of water. Follow the directions on the prescription label. Take it with food or milk to avoid stomach upset. If you are taking this medicine once a day, take it in the morning. Do not take more medicine than you are told to take. Do not suddenly stop taking your medicine because you may develop a severe reaction. Your doctor will tell you how much medicine to take. If your doctor wants you to stop the medicine, the dose may be slowly lowered over time to avoid any side effects. Talk to your pediatrician regarding the use of this medicine in children. Special care may be needed. Overdosage: If you think you have taken too much of this medicine contact a poison control center or emergency room at once. NOTE: This medicine is only for you. Do not share this medicine with others. What if I miss a dose? If you miss a dose, take it as soon as you can. If it is almost time for your next dose, talk to your doctor or health care professional. You may need to miss a dose or take an extra dose. Do not take double or extra doses without advice. What may interact with this medicine? Do not take this medicine with any of the following medications: -mifepristone This medicine may also interact with the following medications: -tacrolimus -vaccines -warfarin This list may not describe all possible interactions.  Give your health care provider a list of all the medicines, herbs, non-prescription drugs, or dietary supplements you use. Also tell them if you smoke, drink alcohol, or use illegal drugs. Some items may interact with your medicine. What should I watch for while using this medicine? Visit your doctor or health care professional  for regular checks on your progress. If you are taking this medicine for a long time, carry an identification card with your name and address, the type and dose of your medicine, and your doctor's name and address. The medicine may increase your risk of getting an infection. Stay away from people who are sick. Tell your doctor or health care professional if you are around anyone with measles or chickenpox. If you are going to have surgery, tell your doctor or health care professional that you have taken this medicine within the last twelve months. Ask your doctor or health care professional about your diet. You may need to lower the amount of salt you eat. The medicine can increase your blood sugar. If you are a diabetic check with your doctor if you need help adjusting the dose of your diabetic medicine. What side effects may I notice from receiving this medicine? Side effects that you should report to your doctor or health care professional as soon as possible: -allergic reactions like skin rash, itching or hives, swelling of the face, lips, or tongue -eye pain, decreased or blurred vision, or bulging eyes -fever, sore throat, sneezing, cough, or other signs of infection, wounds that will not heal -increased thirst -mental depression, mood swings, mistaken feelings of self importance or of being mistreated -pain in hips, back, ribs, arms, shoulders, or legs -swelling of the ankles, feet, hands -trouble passing urine or change in the amount of urine Side effects that usually do not require medical attention (report to your doctor or health care professional if they continue or are bothersome): -confusion, excitement, restlessness -headache -nausea, vomiting -skin problems, acne, thin and shiny skin -weight gain This list may not describe all possible side effects. Call your doctor for medical advice about side effects. You may report side effects to FDA at 1-800-FDA-1088. Where should I keep  my medicine? Keep out of the reach of children. Store at room temperature between 20 and 25 degrees C (68 and 77 degrees F). Throw away any unused medicine after the expiration date. NOTE: This sheet is a summary. It may not cover all possible information. If you have questions about this medicine, talk to your doctor, pharmacist, or health care provider.    2016, Elsevier/Gold Standard. (2012-03-15 11:38:34)   Lidocaine Topical solution or spray What is this medicine? LIDOCAINE (LYE doe kane) is an anesthetic. It causes loss of feeling in the skin and surrounding tissues. It is used to prevent pain from some procedures. This medicine is also used to treat minor burns, scrapes, and insect bites. This medicine may be used for other purposes; ask your health care provider or pharmacist if you have questions. What should I tell my health care provider before I take this medicine? They need to know if you have any of these conditions: -heart problems -infected, open or damaged skin -an unusual or allergic reaction to lidocaine, other medicines, foods, dyes, or preservatives -pregnant or trying to get pregnant -breast-feeding How should I use this medicine? This medicine is for use on the skin. This medicine can be used in the mouth, nose, or applied to the throat by a health care professional in  a hospital or clinic setting. Follow the directions on the prescription or package label. Apply your medicine at regular intervals. Do not use it more often than directed. Talk to your pediatrician regarding the use of this medicine in children. While this drug may be prescribed for selected conditions, precautions do apply. Overdosage: If you think you have taken too much of this medicine contact a poison control center or emergency room at once. NOTE: This medicine is only for you. Do not share this medicine with others. What if I miss a dose? This does not apply; this medicine is not for regular  use. What may interact with this medicine? -medicines to control heart rhythm This list may not describe all possible interactions. Give your health care provider a list of all the medicines, herbs, non-prescription drugs, or dietary supplements you use. Also tell them if you smoke, drink alcohol, or use illegal drugs. Some items may interact with your medicine. What should I watch for while using this medicine? Be careful to avoid injury while the area is numb and you are not aware of pain. If this medicine is used in the mouth or throat, do not chew gum or eat food for at least one hour. If the area is still numb, you may choke or bite your tongue or cheek if you try to chew or swallow. Also, you may not feel pain from hot foods or drinks. What side effects may I notice from receiving this medicine? Side effects that you should report to your doctor or health care professional as soon as possible: -allergic reactions like skin rash, itching or hives, swelling of the face, lips, or tongue -breathing problems -changes in vision -chills, fever -confused, excitable, nervous, restless -dizzy, drowsy -headache -irregular heartbeat -nausea, vomiting -seizures -tremors Side effects that usually do not require medical attention (report to your doctor or health care professional if they continue or are bothersome): -numb area This list may not describe all possible side effects. Call your doctor for medical advice about side effects. You may report side effects to FDA at 1-800-FDA-1088. Where should I keep my medicine? Keep out of reach of children. Store at room temperature between 15 and 30 degrees C (59 and 86 degrees F). Throw away any unused medicine after the expiration date. NOTE: This sheet is a summary. It may not cover all possible information. If you have questions about this medicine, talk to your doctor, pharmacist, or health care provider.    2016, Elsevier/Gold Standard.  (2007-09-01 11:00:36)

## 2016-03-11 NOTE — Progress Notes (Signed)
[facility]    Provider:  Dr Lucia Gaskins Referring Provider: No ref. provider found Primary Care Physician:  Pcp Not In System  CC:  Cluster headaches  HPI:  Troy [name] is a 60 y.o. male here as a referral for for cluster headache. PMHx of cluster headache, embolic strokes s/p PFO closure,HTN, high cholesterol, depression.  Onset years ago without inciting event, no trauma, no initiating event. Happens mostly in the middle of the night, stabbing behind the left eye, lasts 30-60 minutes, severe jabs and jolts lasting fraction of a second in clusters, paroxysmal. They last about a month and occur every night and then subside for 1-2 years.  Has a FHx of cluster headaches in father. He was on Caffergot in the past which helped but it is off of the market. He is on imitrex given by another neurologist, he knows these medications are contraindicated in strokes due to vascular disease however his strokes were secondary to emboli due to PFO and he has had closure, he denies any cardiac or cerebral vascular disease. The PFO was fixed. No further stroke symptoms. No modifying factors or associated symptoms. Worse in the middle of the night but has happened during the day rarely. Jolts are around the left eye, he has lacrimation and rhinorrhea on that side during the episodes. Has taken imitrex and it helps. The last time time he had the cluster was 3 days ago. Alcohol can trigger. No other associated symptoms or modifying factors. Pain can be severe 10/10.   Reviewed notes, labs and imaging from outside physicians, which showed:cbc normal, bmp essentially normal 06/2015 with normal creatinine 0.87  Personally reviewed images of mri brain 05/2015 9also reviewed these with patient): : 1. Scattered small/punctate acute infarcts in the right MCA territory, including the right motor strip near the left upper extremity representation, right caudate. 2. No hemorrhage or mass effect. 3. Small  chronic cortical infarct in the left MCA territory. Small chronic micro hemorrhage in the left cerebellum.  Review of Systems: Patient complains of symptoms per HPI as well as the following symptoms: No cp, no sob. +snoring.  Pertinent negatives per HPI. All others negative.   Social History   Social History  . Marital status: Married    Spouse name: Alona Bene  . Number of children: 1  . Years of education: PhD   Occupational History  . [employer]    Social History Main Topics  . Smoking status: Never Smoker  . Smokeless tobacco: Never Used  . Alcohol use 3.0 oz/week    5 Cans of beer per week  . Drug use: No  . Sexual activity: Not Currently   Other Topics Concern  . Not on file   Social History Narrative   Lives with wife Marella Bile from [place]. Moved about 4 years ago. 03/11/16   Caffeine use: daily       Family History  Problem Relation Age of Onset  . Headache Father     Past Medical History:  Diagnosis Date  . Cluster headaches    "q couple years" (07/15/2015)  . Depression    "mood stabilizer"  . Hypercholesterolemia   . Hypertension   . PFO with atrial septal aneurysm 07/15/2015   s/p 35 mm Amplatzer PFO device by Dr. Excell Seltzer 07/15/2015  . Stroke [facility]) 11/2014; 05/2015   denies residual from either on 07/15/2015    Past Surgical History:  Procedure Laterality Date  . CARDIAC CATHETERIZATION N/A 07/15/2015   Procedure:  PFO Closure;  Surgeon: Tonny BollmanMichael Cooper, MD;  Location: [facility] INVASIVE CV LAB;  Service: Cardiovascular;  Laterality: N/A;  . KNEE ARTHROSCOPY Right 2013   "meniscus repair"  . PATENT FORAMEN OVALE CLOSURE  07/15/2015  . SHOULDER OPEN ROTATOR CUFF REPAIR Right 1990    Current Outpatient Prescriptions  Medication Sig Dispense Refill  . aspirin EC 81 MG tablet Take 1 tablet (81 mg total) by mouth daily. 30 tablet 0  . atorvastatin (LIPITOR) 20 MG tablet Take 20 mg by mouth daily.   7  . PARoxetine (PAXIL) 10 MG tablet Take 10 mg by mouth  daily.   0  . triamterene-hydrochlorothiazide (MAXZIDE-25) 37.5-25 MG tablet Take 1 tablet by mouth daily.  0  . methylPREDNISolone (MEDROL DOSEPAK) 4 MG TBPK tablet follow package directions 21 tablet 2   No current facility-administered medications for this visit.     Allergies as of 03/11/2016  . (No Known Allergies)    Vitals: BP 122/85 (BP Location: Left Arm, Patient Position: Sitting, Cuff Size: Normal)   Pulse 64   Ht 6' (1.829 m)   Wt 211 lb (95.7 kg)   BMI 28.62 kg/m  Last Weight:  Wt Readings from Last 1 Encounters:  03/11/16 211 lb (95.7 kg)   Last Height:   Ht Readings from Last 1 Encounters:  03/11/16 6' (1.829 m)   Physical exam: Exam: Gen: NAD, conversant, well nourised, well groomed                     CV: RRR, no MRG. No Carotid Bruits. No peripheral edema, warm, nontender Eyes: Conjunctivae clear without exudates or hemorrhage  Neuro: Detailed Neurologic Exam  Speech:    Speech is normal; fluent and spontaneous with normal comprehension.  Cognition:    The patient is oriented to person, place, and time;     recent and remote memory intact;     language fluent;     normal attention, concentration,     fund of knowledge Cranial Nerves:    The pupils are equal, round, and reactive to light. The fundi are normal and spontaneous venous pulsations are present. Visual fields are full to finger confrontation. Extraocular movements are intact. Trigeminal sensation is intact and the muscles of mastication are normal. The face is symmetric. The palate elevates in the midline. Hearing intact. Voice is normal. Shoulder shrug is normal. The tongue has normal motion without fasciculations.   Coordination:    Normal finger to nose and heel to shin. Normal rapid alternating movements.   Gait:    Heel-toe and tandem gait are normal.   Motor Observation:    No asymmetry, no atrophy, and no involuntary movements noted. Tone:    Normal muscle tone.    Posture:     Posture is normal. normal erect    Strength:    Strength is V/V in the upper and lower limbs.      Sensation: intact to LT     Reflex Exam:  DTR's:    Deep tendon reflexes in the upper and lower extremities are normal bilaterally.   Toes:    The toes are downgoing bilaterally.   Clonus:    Clonus is absent.       Assessment/Plan:  60 year old patient with cluster headaches. Discussed acute and chronic management of medications. Patient elects not to start chronic management such as verapamil at this time. We'll focus on acute management.  Lidocaine spray acutely in the nostril q1hr. Will  call compounding pharmacy.  Referral for oxygen at home Steroids one week. Discussed side effects Discussed sphenocath and nerve blocks acutely in the office if needed  Discussed: To prevent or relieve headaches, try the following: Cool Compress. Lie down and place a cool compress on your head.  Avoid headache triggers. If certain foods or odors seem to have triggered your migraines in the past, avoid them. A headache diary might help you identify triggers.  Include physical activity in your daily routine. Try a daily walk or other moderate aerobic exercise.  Manage stress. Find healthy ways to cope with the stressors, such as delegating tasks on your to-do list.  Practice relaxation techniques. Try deep breathing, yoga, massage and visualization.  Eat regularly. Eating regularly scheduled meals and maintaining a healthy diet might help prevent headaches. Also, drink plenty of fluids.  Follow a regular sleep schedule. Sleep deprivation might contribute to headaches Consider biofeedback. With this mind-body technique, you learn to control certain bodily functions - such as muscle tension, heart rate and blood pressure - to prevent headaches or reduce headache pain.    Proceed to emergency room if you experience new or worsening symptoms or symptoms do not resolve, if you have new neurologic  symptoms or if headache is severe, or for any concerning symptom.     Naomie Dean, MD  [facility] [address] [place], [place] [address]  Phone [phone] Fax [phone]

## 2016-03-12 ENCOUNTER — Encounter: Payer: Self-pay | Admitting: *Deleted

## 2016-03-12 ENCOUNTER — Telehealth: Payer: Self-pay | Admitting: Neurology

## 2016-03-12 ENCOUNTER — Telehealth: Payer: Self-pay | Admitting: *Deleted

## 2016-03-12 NOTE — Telephone Encounter (Signed)
Dr Lucia GaskinsAhern- FYI  Called and spoke to [place] at [facility]. Gave verbal order per Dr Lucia GaskinsAhern to get lidocaine nasal spray 4% with 11 refills. Instructions: 1 spray in affected nostril every 1 hour as needed.  She took pt name, DOB, phone number, Dr Lucia GaskinsAhern name and DEA number.  She states she will contact the patient once it is ready to be picked up. They do not file with insurance but she can give pt a copy of claim form to submit to insurance.   Added medication to patient med list.

## 2016-03-12 NOTE — Telephone Encounter (Signed)
Re-faxed order as requested including "gas portable". Received fax confirmation.

## 2016-03-12 NOTE — Progress Notes (Signed)
[facility] Instructions: At onset of cluster headache 100% 02, 7-10 LPM, max 12LPM for up to 15 minutes. Can repeat up to 3 times per day per Dr Lucia GaskinsAhern verbal order. Fax: [phone]. Received confirmation.

## 2016-03-12 NOTE — Telephone Encounter (Signed)
Dr Lucia GaskinsAhern- how would you like patient to take lidocaine nasal spray? How many refills?  I can call it in to [facility].  [facility] [place] [facility]She needs to have instructions, patient name and DOB and any refills on rx.  She stated she would need prescription for lidocaine nasal spray faxed or called into to them at [phone].

## 2016-03-12 NOTE — Telephone Encounter (Signed)
Angie/ [facility] called requesting " gas portable" be added to the rx and resent. May call [phone]

## 2016-03-27 ENCOUNTER — Encounter: Payer: Self-pay | Admitting: Neurology

## 2017-03-08 ENCOUNTER — Other Ambulatory Visit (HOSPITAL_COMMUNITY): Payer: BC Managed Care – PPO

## 2017-03-15 ENCOUNTER — Ambulatory Visit: Payer: BC Managed Care – PPO | Admitting: Neurology

## 2017-03-22 ENCOUNTER — Other Ambulatory Visit: Payer: Self-pay

## 2017-03-22 ENCOUNTER — Ambulatory Visit (HOSPITAL_COMMUNITY): Payer: BC Managed Care – PPO | Attending: Cardiology

## 2017-03-22 DIAGNOSIS — I5030 Unspecified diastolic (congestive) heart failure: Secondary | ICD-10-CM | POA: Diagnosis not present

## 2017-03-22 DIAGNOSIS — I420 Dilated cardiomyopathy: Secondary | ICD-10-CM | POA: Insufficient documentation

## 2017-03-22 DIAGNOSIS — Q211 Atrial septal defect: Secondary | ICD-10-CM

## 2017-03-22 DIAGNOSIS — I253 Aneurysm of heart: Secondary | ICD-10-CM

## 2017-03-29 ENCOUNTER — Ambulatory Visit (INDEPENDENT_AMBULATORY_CARE_PROVIDER_SITE_OTHER): Payer: BC Managed Care – PPO | Admitting: Cardiovascular Disease

## 2017-03-29 ENCOUNTER — Encounter: Payer: Self-pay | Admitting: Cardiovascular Disease

## 2017-03-29 ENCOUNTER — Telehealth: Payer: Self-pay

## 2017-03-29 VITALS — BP 120/80 | HR 57 | Ht 72.0 in | Wt 203.0 lb

## 2017-03-29 DIAGNOSIS — Q2112 Patent foramen ovale: Secondary | ICD-10-CM

## 2017-03-29 DIAGNOSIS — I712 Thoracic aortic aneurysm, without rupture, unspecified: Secondary | ICD-10-CM

## 2017-03-29 DIAGNOSIS — Q211 Atrial septal defect: Secondary | ICD-10-CM | POA: Diagnosis not present

## 2017-03-29 DIAGNOSIS — I77810 Thoracic aortic ectasia: Secondary | ICD-10-CM

## 2017-03-29 LAB — BASIC METABOLIC PANEL
BUN/Creatinine Ratio: 18 (ref 10–24)
BUN: 16 mg/dL (ref 8–27)
CO2: 28 mmol/L (ref 20–29)
Calcium: 9.2 mg/dL (ref 8.6–10.2)
Chloride: 102 mmol/L (ref 96–106)
Creatinine, Ser: 0.91 mg/dL (ref 0.76–1.27)
GFR calc Af Amer: 103 mL/min/{1.73_m2} (ref >59)
GFR calc non Af Amer: 89 mL/min/{1.73_m2} (ref >59)
Glucose: 89 mg/dL (ref 65–99)
Potassium: 4.4 mmol/L (ref 3.5–5.2)
Sodium: 141 mmol/L (ref 134–144)

## 2017-03-29 NOTE — Patient Instructions (Signed)
Medication Instructions:  Your provider recommends that you continue on your current medications as directed. Please refer to the Current Medication list given to you today.    Labwork: TODAY: BMET  Testing/Procedures: Dr. Excell Seltzer recommends you have a CHEST CT.  Follow-Up: Your provider wants you to follow-up in: 1 year with Dr. Excell Seltzer. You will receive a reminder letter in the mail two months in advance. If you don't receive a letter, please call our office to schedule the follow-up appointment.    Any Other Special Instructions Will Be Listed Below (If Applicable).     If you need a refill on your cardiac medications before your next appointment, please call your pharmacy.

## 2017-03-29 NOTE — Progress Notes (Signed)
Cardiology Office Note Date:  03/30/2017   ID:  Troy [name], DOB [birth year], MRN [id number]  PCP:  Darrow Bussing, MD  Cardiologist:  Tonny Bollman, MD    Chief Complaint  Patient presents with  . Follow-up    PFO closure     History of Present Illness: Troy [name] is a 61 y.o. male who presents for follow-up evaluation.   He underwent transcatheter PFO closure with a 35 mm PFO occluder device in January 2017 after presenting with recurrent TIA's.   The patient is here alone today. He is doing well. He's physically active with regular exercise and has no exertional symptoms. Today, he denies symptoms of palpitations, chest pain, shortness of breath, orthopnea, PND, lower extremity edema, dizziness, or syncope.  He recently had an echo showing normal position of the PFO occluder device and a dilated aorta 4.3 cm. There is no aortic insufficiency.   Past Medical History:  Diagnosis Date  . Cluster headaches    "q couple years" (07/15/2015)  . Depression    "mood stabilizer"  . Hypercholesterolemia   . Hypertension   . PFO with atrial septal aneurysm 07/15/2015   s/p 35 mm Amplatzer PFO device by Dr. Excell Seltzer 07/15/2015  . Stroke [facility]) 11/2014; 05/2015   denies residual from either on 07/15/2015    Past Surgical History:  Procedure Laterality Date  . CARDIAC CATHETERIZATION N/A 07/15/2015   Procedure: PFO Closure;  Surgeon: Tonny Bollman, MD;  Location: [facility] INVASIVE CV LAB;  Service: Cardiovascular;  Laterality: N/A;  . KNEE ARTHROSCOPY Right 2013   "meniscus repair"  . PATENT FORAMEN OVALE CLOSURE  07/15/2015  . SHOULDER OPEN ROTATOR CUFF REPAIR Right 1990    Current Outpatient Prescriptions  Medication Sig Dispense Refill  . amLODipine (NORVASC) 2.5 MG tablet Take 2.5 mg by mouth daily.    Marland Kitchen aspirin EC 81 MG tablet Take 1 tablet (81 mg total) by mouth daily. 30 tablet 0  . atorvastatin (LIPITOR) 20 MG tablet Take 20 mg by mouth daily.   7  . PARoxetine (PAXIL) 10  MG tablet Take 10 mg by mouth daily.   0  . PRESCRIPTION MEDICATION Place 1 spray into the nose as directed. Lidocaine nasal spray 4% compounded. I spray in affected nostril every 1 hr as directed    . SUMAtriptan (IMITREX) 50 MG tablet Take 1 tablet by mouth as directed. May repeat once in 2 hours. Do not exceed 200 mg per day.    . triamterene-hydrochlorothiazide (MAXZIDE-25) 37.5-25 MG tablet Take 1 tablet by mouth daily.  0   No current facility-administered medications for this visit.     Allergies:   Patient has no known allergies.   Social History:  The patient  reports that he has never smoked. He has never used smokeless tobacco. He reports that he drinks about 3.0 oz of alcohol per week . He reports that he does not use drugs.   Family History:  The patient's  family history includes Headache in his father.    ROS:  Please see the history of present illness.  All other systems are reviewed and negative.    PHYSICAL EXAM: VS:  BP 120/80   Pulse (!) 57   Ht 6' (1.829 m)   Wt 92.1 kg (203 lb)   BMI 27.53 kg/m  , BMI Body mass index is 27.53 kg/m. GEN: Well nourished, well developed, in no acute distress  HEENT: normal  Neck: no JVD, no masses. No carotid bruits Cardiac:  RRR without murmur or gallop                Respiratory:  clear to auscultation bilaterally, normal work of breathing GI: soft, nontender, nondistended, + BS MS: no deformity or atrophy  Ext: no pretibial edema, pedal pulses 2+= bilaterally Skin: warm and dry, no rash Neuro:  Strength and sensation are intact Psych: euthymic mood, full affect  EKG:  EKG is ordered today. The ekg ordered today shows sinus brady 57 bpm, first degree AV block, otherwise normal  Recent Labs: 03/29/2017: BUN 16; Creatinine, Ser 0.91; Potassium 4.4; Sodium 141   Lipid Panel     Component Value Date/Time   CHOL 138 06/08/2015 1700   TRIG 52 06/08/2015 1700   HDL 60 06/08/2015 1700   CHOLHDL 2.3 06/08/2015 1700   VLDL  10 06/08/2015 1700   LDLCALC 68 06/08/2015 1700      Wt Readings from Last 3 Encounters:  03/29/17 92.1 kg (203 lb)  03/11/16 95.7 kg (211 lb)  03/06/16 95.3 kg (210 lb)     Cardiac Studies Reviewed: 2D Echo 03-22-2017: Study Conclusions  - Left ventricle: The cavity size was normal. There was mild focal   basal hypertrophy of the septum. Systolic function was normal.   The estimated ejection fraction was in the range of 55% to 60%.   Wall motion was normal; there were no regional wall motion   abnormalities. Doppler parameters are consistent with abnormal   left ventricular relaxation (grade 1 diastolic dysfunction). - Aortic valve: Trileaflet; mildly thickened, mildly calcified   leaflets. - Aorta: Ascending aortic diameter: 43 mm (S). - Ascending aorta: The ascending aorta was mildly dilated. - Left atrium: The atrium was at the upper limits of normal in   size. Volume/bsa, ES, (1-plane Simpson&'s, A2C): 32.4 ml/m^2. - Right ventricle: The cavity size was mildly dilated. Wall   thickness was normal. - Atrial septum: An Amplatzer closure device was present and   functioning normally. No shunt  Cardiac Cath 07-15-2015: Conclusion   Successful transcatheter PFO closure with a 35 mm Amplatzer PFO device. Slightly positive agitated saline study at the completion of the procedure is within expected limits and would anticipate complete closure after full endotheliazation in 6 months.    ASSESSMENT AND PLAN: 1.  PFO s/p transcatheter closure: no recurrent events noted. Continue ASA 81 mg daily. Echo reviewed as above.   2. HTN - controlled on amlodipine.  3. Hyperlipidemia: treated with atorvastatin, lipids followed by PCP.   4. Dilated thoracic aorta: 4.3 cm by echo, previously 4.0 cm. BP controlled. No family hx of aortopathy and pt has a tricuspid aortic valve. Will check a chest CTA to better evaluate his aorta.   Current medicines are reviewed with the patient today.   The patient does not have concerns regarding medicines.  Labs/ tests ordered today include:   Orders Placed This Encounter  Procedures  . CT ANGIO CHEST AORTA W &/OR WO CONTRAST  . Basic metabolic panel  . EKG 12-Lead    Disposition:   FU one year  Signed, Tonny Bollman, MD  03/30/2017 4:32 PM    [facility] [address] [place], [place], [place]  [address] Phone: [phone]; Fax: [phone]

## 2017-03-29 NOTE — Telephone Encounter (Signed)
-----   Message from Tonny Bollman, MD sent at 03/24/2017  8:34 AM EDT ----- Essentially normal study post-PFO closure. Mild aortic root dilatation noted. Recommend repeat echo in 2 years to reassess (has been stable over time in reviewing past echo studies)

## 2017-03-29 NOTE — Telephone Encounter (Signed)
Reviewed at [facility] today. Repeat ECHO ordered to be scheduled in 2 years.

## 2017-04-13 ENCOUNTER — Ambulatory Visit (INDEPENDENT_AMBULATORY_CARE_PROVIDER_SITE_OTHER)
Admission: RE | Admit: 2017-04-13 | Discharge: 2017-04-13 | Disposition: A | Discharge status: Home / Self Care | Payer: BC Managed Care – PPO | Source: Ambulatory Visit | Attending: Cardiovascular Disease | Admitting: Cardiovascular Disease

## 2017-04-13 DIAGNOSIS — I712 Thoracic aortic aneurysm, without rupture, unspecified: Secondary | ICD-10-CM

## 2017-04-13 MED ORDER — IOPAMIDOL (ISOVUE-370) INJECTION 76%
100.0000 mL | Freq: Once | INTRAVENOUS | Status: AC | PRN
  Administered 2017-04-13: 100 mL via INTRAVENOUS

## 2017-04-15 ENCOUNTER — Telehealth: Payer: Self-pay | Admitting: Cardiovascular Disease

## 2017-04-15 NOTE — Telephone Encounter (Signed)
New Message ° ° pt verbalized that she is returning call for rn °

## 2017-04-15 NOTE — Telephone Encounter (Signed)
Informed patient of results and verbal understanding expressed.  He understands Dr. Excell Seltzerooper will discuss further imaging with him at [facility] next year.  ECHO recall in the system. Patient agrees with treatment plan.

## 2017-04-15 NOTE — Telephone Encounter (Signed)
-----   Message from Tonny BollmanMichael Cooper, MD sent at 04/14/2017 11:08 PM EDT ----- CTA consistent with aortic dilatation seen on echo. Plan repeat echo in 2 years. Will discuss further imaging at office visit next year. thx

## 2017-04-26 ENCOUNTER — Ambulatory Visit (INDEPENDENT_AMBULATORY_CARE_PROVIDER_SITE_OTHER): Payer: BC Managed Care – PPO | Admitting: Neurology

## 2017-04-26 ENCOUNTER — Encounter: Payer: Self-pay | Admitting: Neurology

## 2017-04-26 VITALS — BP 120/82 | HR 60 | Ht 72.0 in | Wt 200.0 lb

## 2017-04-26 DIAGNOSIS — G44009 Cluster headache syndrome, unspecified, not intractable: Secondary | ICD-10-CM | POA: Diagnosis not present

## 2017-04-26 MED ORDER — SUMATRIPTAN SUCCINATE 3 MG/0.5ML ~~LOC~~ SOAJ: 3.0000 mg | Freq: Once | SUBCUTANEOUS | 11 refills | Status: DC | PRN

## 2017-04-26 MED ORDER — METHYLPREDNISOLONE 4 MG PO TBPK: ORAL_TABLET | ORAL | 6 refills | Status: DC

## 2017-04-26 NOTE — Patient Instructions (Addendum)
At onset of cluster headache, Take Zembrace right away may repeat in 15 minutes. May repeat injections in 2 hours Start Medrol dosepak Call the office   (Look up "CGRP migraine" for your information)  Methylprednisolone tablets What is this medicine? METHYLPREDNISOLONE (meth ill pred NISS oh lone) is a corticosteroid. It is commonly used to treat inflammation of the skin, joints, lungs, and other organs. Common conditions treated include asthma, allergies, and arthritis. It is also used for other conditions, such as blood disorders and diseases of the adrenal glands. This medicine may be used for other purposes; ask your health care provider or pharmacist if you have questions. COMMON BRAND NAME(S): Medrol, Medrol Dosepak What should I tell my health care provider before I take this medicine? They need to know if you have any of these conditions: -Cushing's syndrome -eye disease, vision problems -diabetes -glaucoma -heart disease -high blood pressure -infection (especially a virus infection such as chickenpox, cold sores, or herpes) -liver disease -mental illness -myasthenia gravis -osteoporosis -recently received or scheduled to receive a vaccine -seizures -stomach or intestine problems -thyroid disease -an unusual or allergic reaction to lactose, methylprednisolone, other medicines, foods, dyes, or preservatives -pregnant or trying to get pregnant -breast-feeding How should I use this medicine? Take this medicine by mouth with a glass of water. Follow the directions on the prescription label. Take this medicine with food. If you are taking this medicine once a day, take it in the morning. Do not take it more often than directed. Do not suddenly stop taking your medicine because you may develop a severe reaction. Your doctor will tell you how much medicine to take. If your doctor wants you to stop the medicine, the dose may be slowly lowered over time to avoid any side  effects. Talk to your pediatrician regarding the use of this medicine in children. Special care may be needed. Overdosage: If you think you have taken too much of this medicine contact a poison control center or emergency room at once. NOTE: This medicine is only for you. Do not share this medicine with others. What if I miss a dose? If you miss a dose, take it as soon as you can. If it is almost time for your next dose, talk to your doctor or health care professional. You may need to miss a dose or take an extra dose. Do not take double or extra doses without advice. What may interact with this medicine? Do not take this medicine with any of the following medications: -alefacept -echinacea -live virus vaccines -metyrapone -mifepristone This medicine may also interact with the following medications: -amphotericin B -aspirin and aspirin-like medicines -certain antibiotics like erythromycin, clarithromycin, troleandomycin -certain medicines for diabetes -certain medicines for fungal infections like ketoconazole -certain medicines for seizures like carbamazepine, phenobarbital, phenytoin -certain medicines that treat or prevent blood clots like warfarin -cholestyramine -cyclosporine -digoxin -diuretics -male hormones, like estrogens and birth control pills -isoniazid -NSAIDs, medicines for pain inflammation, like ibuprofen or naproxen -other medicines for myasthenia gravis -rifampin -vaccines This list may not describe all possible interactions. Give your health care provider a list of all the medicines, herbs, non-prescription drugs, or dietary supplements you use. Also tell them if you smoke, drink alcohol, or use illegal drugs. Some items may interact with your medicine. What should I watch for while using this medicine? Tell your doctor or healthcare professional if your symptoms do not start to get better or if they get worse. Do not stop taking except on your  doctor's advice.  You may develop a severe reaction. Your doctor will tell you how much medicine to take. This medicine may increase your risk of getting an infection. Tell your doctor or health care professional if you are around anyone with measles or chickenpox, or if you develop sores or blisters that do not heal properly. This medicine may affect blood sugar levels. If you have diabetes, check with your doctor or health care professional before you change your diet or the dose of your diabetic medicine. Tell your doctor or health care professional right away if you have any change in your eyesight. Using this medicine for a long time may increase your risk of low bone mass. Talk to your doctor about bone health. What side effects may I notice from receiving this medicine? Side effects that you should report to your doctor or health care professional as soon as possible: -allergic reactions like skin rash, itching or hives, swelling of the face, lips, or tongue -bloody or tarry stools -changes in vision -hallucination, loss of contact with reality -muscle cramps -muscle pain -palpitations -signs and symptoms of high blood sugar such as dizziness; dry mouth; dry skin; fruity breath; nausea; stomach pain; increased hunger or thirst; increased urination -signs and symptoms of infection like fever or chills; cough; sore throat; pain or trouble passing urine -trouble passing urine or change in the amount of urine Side effects that usually do not require medical attention (report to your doctor or health care professional if they continue or are bothersome): -changes in emotions or mood -constipation -diarrhea -excessive hair growth on the face or body -headache -nausea, vomiting -trouble sleeping -weight gain This list may not describe all possible side effects. Call your doctor for medical advice about side effects. You may report side effects to FDA at 1-800-FDA-1088. Where should I keep my medicine? Keep  out of the reach of children. Store at room temperature between 20 and 25 degrees C (68 and 77 degrees F). Throw away any unused medicine after the expiration date. NOTE: This sheet is a summary. It may not cover all possible information. If you have questions about this medicine, talk to your doctor, pharmacist, or health care provider.  2018 Elsevier/Gold Standard (2015-08-22 15:53:30)   Sumatriptan injection What is this medicine? SUMATRIPTAN (soo ma TRIP tan) is used to treat migraines with or without aura. An aura is a strange feeling or visual disturbance that warns you of an attack. It is not used to prevent migraines. This medicine may be used for other purposes; ask your health care provider or pharmacist if you have questions. COMMON BRAND NAME(S): Alsuma, Imitrex, Imitrex Statdose, Sumavel DosePro System What should I tell my health care provider before I take this medicine? They need to know if you have any of these conditions: -circulation problems in fingers and toes -diabetes -heart disease -high blood pressure -high cholesterol -history of irregular heartbeat -history of stroke -kidney disease -liver disease -postmenopausal or surgical removal of uterus and ovaries -seizures -smoke tobacco -stomach or intestine problems -an unusual or allergic reaction to sumatriptan, other medicines, foods, dyes, or preservatives -pregnant or trying to get pregnant -breast-feeding How should I use this medicine? This medicine is for injection under the skin. Follow the directions on the prescription label. Only use this medicine at the first symptoms of a migraine. It is not for everyday use. If you are using an autoinjector, read the instruction leaflet carefully. A single injection is given just under the skin. Before  you make an injection, clean and examine your skin. Do not inject at a place where the skin is damaged or infected. If your symptoms return you can use a second  injection. If there is no improvement at all in your symptoms after the first injection, call your doctor or health care professional. Wait at least 1 hour between doses and do not use more than 6 mg as a single dose. Do not use more than 12 mg total in any 24 hour period. Do not use your medicine more often than directed. Talk to your pediatrician regarding the use of this medicine in children. Special care may be needed. Overdosage: If you think you have taken too much of this medicine contact a poison control center or emergency room at once. NOTE: This medicine is only for you. Do not share this medicine with others. What if I miss a dose? This does not apply; this medicine is not for regular use. What may interact with this medicine? Do not take this medicine with any of the following medicines: -cocaine -ergot alkaloids like dihydroergotamine, ergonovine, ergotamine, methylergonovine -feverfew -MAOIs like Carbex, Eldepryl, Marplan, Nardil, and Parnate -other medicines for migraine headache like almotriptan, eletriptan, frovatriptan, naratriptan, rizatriptan, zolmitriptan -tryptophan This medicine may also interact with the following medications: -certain medicines for depression, anxiety, or psychotic disturbances This list may not describe all possible interactions. Give your health care provider a list of all the medicines, herbs, non-prescription drugs, or dietary supplements you use. Also tell them if you smoke, drink alcohol, or use illegal drugs. Some items may interact with your medicine. What should I watch for while using this medicine? Only take this medicine for a migraine headache. Take it if you get warning symptoms or at the start of a migraine attack. It is not for regular use to prevent migraine attacks. You may get drowsy or dizzy. Do not drive, use machinery, or do anything that needs mental alertness until you know how this medicine affects you. Do not stand or sit up  quickly, especially if you are an older patient. This reduces the risk of dizzy or fainting spells. Alcohol may interfere with the effect of this medicine. Avoid alcoholic drinks. Smoking cigarettes may increase the risk of heart-related side effects from using this medicine. If you take migraine medicines for 10 or more days a month, your migraines may get worse. Keep a diary of headache days and medicine use. Contact your healthcare professional if your migraine attacks occur more frequently. What side effects may I notice from receiving this medicine? Side effects that you should report to your doctor or health care professional as soon as possible: -allergic reactions like skin rash, itching or hives, swelling of the face, lips, or tongue -bloody or watery diarrhea -hallucination, loss of contact with reality -pain, tingling, numbness in the face, hands, or feet -seizures -signs and symptoms of a blood clot such as breathing problems; changes in vision; chest pain; severe, sudden headache; pain, swelling, warmth in the leg; trouble speaking; sudden numbness or weakness of the face, arm, or leg -signs and symptoms of a dangerous change in heartbeat or heart rhythm like chest pain; dizziness; fast or irregular heartbeat; palpitations, feeling faint or lightheaded; falls; breathing problems -signs and symptoms of a stroke like changes in vision; confusion; trouble speaking or understanding; severe headaches; sudden numbness or weakness of the face, arm, or leg; trouble walking; dizziness; loss of balance or coordination -stomach pain Side effects that usually do not  require medical attention (report to your doctor or health care professional if they continue or are bothersome): -changes in taste -facial flushing -headache -muscle cramps -muscle pain -nausea, vomiting -pain, redness, or irritation at site where injected -weak or tired This list may not describe all possible side effects. Call  your doctor for medical advice about side effects. You may report side effects to FDA at 1-800-FDA-1088. Where should I keep my medicine? Keep out of the reach of children. Store at room temperature between 2 and 30 degrees C (36 and 86 degrees F). Protect from light. Throw away any unused medicine after the expiration date. Make sure you receive a puncture-resistant container to dispose of the needles and syringes once you have finished with them. Do not reuse these items. Return the container to your health care professional for proper disposal. Keep the autoinjector device. NOTE: This sheet is a summary. It may not cover all possible information. If you have questions about this medicine, talk to your doctor, pharmacist, or health care provider.  2018 Elsevier/Gold Standard (2015-07-18 12:41:27)   Cluster Headache A cluster headache is a type of headache that causes deep, intense head pain. Cluster headaches can last from 15 minutes to 3 hours. They usually occur:  On one side of the head. They may occur on the other side when a new cluster of headaches begins.  Repeatedly over weeks to months.  Several times a day.  At the same time of day, often at night.  More often in the fall and springtime.  What are the causes? The cause of this condition is not known. What increases the risk? This condition is more likely to develop in:  Males.  People who drink alcohol.  People who smoke or use products that contain nicotine or tobacco.  People who take medicines that cause blood vessels to expand, such as nitroglycerin.  People who take antihistamines.  What are the signs or symptoms? Symptoms of this condition include:  Severe pain on one side of the head that begins behind or around your eye or temple.  Pain on one side of the head.  Nausea.  Sensitivity to light.  Runny nose and nasal stuffiness.  Sweaty, pale skin on the face.  Droopy or swollen eyelid, eye  redness, or tearing.  Restlessness and agitation.  How is this diagnosed? This condition may be diagnosed based on:  Your symptoms.  A physical exam.  Your health care provider may order tests to see if your headaches are caused by another medical condition. These tests may show that you do not have cluster headaches. Tests may include:  A CT scan of your head.  An MRI of your head.  Lab tests.  How is this treated? This condition may be treated with:  Medicines to relieve pain and to prevent repeated (recurrent) attacks. Some people may need a combination of medicines.  Oxygen. This helps to relieve pain.  Follow these instructions at home: Headache diary Keep a headache diary as told by your health care provider. Doing this can help you and your health care provider figure out what triggers your headaches. In your headache diary, include information about:  The time of day that your headache started and what you were doing when it began.  How long your headache lasted.  Where your pain started and whether it moved to other areas.  The type of pain, such as burning, stabbing, throbbing, or cramping.  Your level of pain. Use a pain  scale and rate the pain with a number from 1 (mild) up to 10 (severe).  The treatment that you used, and any change in symptoms after treatment.  Medicines  Take over-the-counter and prescription medicines only as told by your health care provider.  Do not drive or use heavy machinery while taking prescription pain medicine.  Use oxygen as told by your health care provider. Lifestyle  Follow a regular sleep schedule. Do not vary the time that you go to bed or the amount that you sleep from day to day. It is important to stay on the same schedule during a cluster period to help prevent headaches.  Exercise regularly.  Eat a healthy diet and avoid foods that may trigger your headaches.  Avoid alcohol.  Do not use any products that  contain nicotine or tobacco, such as cigarettes and e-cigarettes. If you need help quitting, ask your health care provider. Contact a health care provider if:  Your headaches change, become more severe, or occur more often.  The medicine or oxygen that your health care provider recommended does not help. Get help right away if:  You faint.  You have weakness or numbness, especially on one side of your body or face.  You have double vision.  You have nausea or vomiting that does not go away within several hours.  You have trouble talking, walking, or keeping your balance.  You have pain or stiffness in your neck.  You have a fever. Summary  A cluster headache is a type of headache that causes deep, intense head pain, usually on one side of the head.  Keep a headache diary to help discover what triggers your headaches.  A regular sleep schedule can help prevent headaches. This information is not intended to replace advice given to you by your health care provider. Make sure you discuss any questions you have with your health care provider. Document Released: 06/15/2005 Document Revised: 02/25/2016 Document Reviewed: 02/25/2016 Elsevier Interactive Patient Education  [employer].

## 2017-04-26 NOTE — Progress Notes (Signed)
[facility]    Provider:  Dr Troy [name] Referring Provider: No ref. provider found Primary Care Physician:  Troy BussingKoirala, Dibas, MD  Troy CoffinRobert [name] is a 61 y.o. male here as a referral for for cluster headache. PMHx of cluster headache, embolic strokes s/p PFO closure,HTN, high cholesterol, depression.  Onset years ago without inciting event, no trauma, no initiating event. Happens mostly in the middle of the night, stabbing behind the left eye, lasts 30-60 minutes, severe jabs and jolts lasting fraction of a second in clusters, paroxysmal. They last about a month and occur every night and then subside for 1-2 years.  Has a FHx of cluster headaches in father. He was on Caffergot in the past which helped but it is off of the market. He is on imitrex given by another neurologist, he knows these medications are contraindicated in strokes due to vascular disease however his strokes were secondary to emboli due to PFO and he has had closure, he denies any cardiac or cerebral vascular disease. The PFO was fixed. No further stroke symptoms. No modifying factors or associated symptoms. Worse in the middle of the night but has happened during the day rarely. Jolts are around the left eye, he has lacrimation and rhinorrhea on that side during the episodes. Has taken imitrex and it helps. The last time time he had the cluster was 3 days ago. Alcohol can trigger. No other associated symptoms or modifying factors. Pain can be severe 10/10.   Reviewed notes, labs and imaging from outside physicians, which showed:cbc normal, bmp essentially normal 06/2015 with normal creatinine 0.87  Personally reviewed images of mri brain 05/2015 9also reviewed these with patient): : 1. Scattered small/punctate acute infarcts in the right MCA territory, including the right motor strip near the left upper extremity representation, right caudate. 2. No hemorrhage or mass effect. 3. Small chronic cortical infarct in the  left MCA territory. Small chronic micro hemorrhage in the left cerebellum.  Review of Systems: Patient complains of symptoms per HPI as well as the following symptoms: No cp, no sob. +snoring.  Pertinent negatives per HPI. All others negative.  Social History   Social History  . Marital status: Married    Spouse name: Troy [name]  . Number of children: 1  . Years of education: PhD   Occupational History  . [employer]    Social History Main Topics  . Smoking status: Never Smoker  . Smokeless tobacco: Never Used  . Alcohol use 3.0 oz/week    5 Cans of beer per week  . Drug use: No  . Sexual activity: Not Currently   Other Topics Concern  . Not on file   Social History Narrative   Lives with wife Troy [name]   [place]oved from [place]. Moved about 5 years ago   Caffeine use: daily       Family History  Problem Relation Age of Onset  . Headache Father     Past Medical History:  Diagnosis Date  . Cluster headaches    "q couple years" (07/15/2015)  . Depression    "mood stabilizer"  . Hypercholesterolemia   . Hypertension   . PFO with atrial septal aneurysm 07/15/2015   s/p 35 mm Amplatzer PFO device by Dr. Excell Seltzerooper 07/15/2015  . Stroke [facility] 11/2014; 05/2015   denies residual from either on 07/15/2015    Past Surgical History:  Procedure Laterality Date  . CARDIAC CATHETERIZATION N/A 07/15/2015   Procedure: PFO Closure;  Surgeon: Troy BollmanMichael Cooper, MD;  [facility];  Service: Cardiovascular;  Laterality: N/A;  . KNEE ARTHROSCOPY Right 2013   "meniscus repair"  . PATENT FORAMEN OVALE CLOSURE  07/15/2015  . SHOULDER OPEN ROTATOR CUFF REPAIR Right 1990    Current Outpatient Prescriptions  Medication Sig Dispense Refill  . amLODipine (NORVASC) 2.5 MG tablet Take 2.5 mg by mouth daily.    Marland Kitchen aspirin EC 81 MG tablet Take 1 tablet (81 mg total) by mouth daily. 30 tablet 0  . atorvastatin (LIPITOR) 20 MG tablet Take 20 mg by mouth daily.   7  . PARoxetine (PAXIL) 10 MG tablet  Take 10 mg by mouth daily.   0  . PRESCRIPTION MEDICATION Place 1 spray into the nose as directed. Lidocaine nasal spray 4% compounded. I spray in affected nostril every 1 hr as directed    . SUMAtriptan (IMITREX) 50 MG tablet Take 1 tablet by mouth as directed. May repeat once in 2 hours. Do not exceed 200 mg per day.    . triamterene-hydrochlorothiazide (MAXZIDE-25) 37.5-25 MG tablet Take 1 tablet by mouth daily.  0  . methylPREDNISolone (MEDROL DOSEPAK) 4 MG TBPK tablet follow package directions 21 tablet 6  . SUMAtriptan Succinate (ZEMBRACE SYMTOUCH) 3 MG/0.5ML SOAJ Inject 3 mg into the skin once as needed. May repeat in 15 minutes. If symptoms persist, repeat in 2 hours. Max 4 injections daily. 8 pen 11   No current facility-administered medications for this visit.     Allergies as of 04/26/2017  . (No Known Allergies)    Vitals: BP 120/82   Pulse 60   Ht 6' (1.829 m)   Wt 200 lb (90.7 kg)   BMI 27.12 kg/m  Last Weight:  Wt Readings from Last 1 Encounters:  04/26/17 200 lb (90.7 kg)   Last Height:   Ht Readings from Last 1 Encounters:  04/26/17 6' (1.829 m)   Physical exam: Exam: Gen: NAD, conversant, well nourised, obese, well groomed                     Eyes: Conjunctivae clear without exudates or hemorrhage  Neuro: Detailed Neurologic Exam  Speech:    Speech is normal; fluent and spontaneous with normal comprehension.  Cognition:    The patient is oriented to person, place, and time;     recent and remote memory intact;     language fluent;     normal attention, concentration,     fund of knowledge Cranial Nerves:    The pupils are equal, round, and reactive to light. Visual fields are full to finger confrontation. Extraocular movements are intact. Trigeminal sensation is intact and the muscles of mastication are normal. The face is symmetric. The palate elevates in the midline. Hearing intact. Voice is normal. Shoulder shrug is normal. The tongue has normal  motion without fasciculations.   Motor Observation:    No asymmetry, no atrophy, and no involuntary movements noted. Tone:    Normal muscle tone.    Posture:    Posture is normal. normal erect    Strength:    Strength is V/V in the upper and lower limbs.      Sensation: intact to LT        Assessment/Plan:   61 year old patient with cluster headaches. Discussed acute and chronic management of medications. Patient elects not to start chronic management such as verapamil at this time. We'll focus on acute management.  At onset of headache take Imitrex injectable May repeat in  15 minutes if needed. Start steroids. Call office.  Patient has had a stroke, but since stroke associated with PFO and not vascular disease triptans are not contraindicated.  Provided documentation on cluster headaches, association with migraine with aura and PFO's. Patient recently had his PFO closed, unclear if this will improve his cluster headaches but hasn't had one since then; there is an association between PFO and cluster and migraine with headache.  Discussed: To prevent or relieve headaches, try the following: Cool Compress. Lie down and place a cool compress on your head.  Avoid headache triggers. If certain foods or odors seem to have triggered your migraines in the past, avoid them. A headache diary might help you identify triggers.  Include physical activity in your daily routine. Try a daily walk or other moderate aerobic exercise.  Manage stress. Find healthy ways to cope with the stressors, such as delegating tasks on your to-do list.  Practice relaxation techniques. Try deep breathing, yoga, massage and visualization.  Eat regularly. Eating regularly scheduled meals and maintaining a healthy diet might help prevent headaches. Also, drink plenty of fluids.  Follow a regular sleep schedule. Sleep deprivation might contribute to headaches Consider biofeedback. With this mind-body technique, you  learn to control certain bodily functions - such as muscle tension, heart rate and blood pressure - to prevent headaches or reduce headache pain.    Proceed to emergency room if you experience new or worsening symptoms or symptoms do not resolve, if you have new neurologic symptoms or if headache is severe, or for any concerning symptom.   Provided education and documentation from American headache Society toolbox including articles on: chronic migraine medication overuse headache, chronic migraines, prevention of migraines, behavioral and other nonpharmacologic treatments for headache.   Naomie Dean, MD  [facility] [address] [place], [place] [address]  Phone [phone] Fax [phone]  A total of 15 minutes was spent face-to-face with this patient. Over half this time was spent on counseling patient on the cluster headache diagnosis and different diagnostic and therapeutic options available.

## 2017-04-30 ENCOUNTER — Telehealth: Payer: Self-pay

## 2017-04-30 DIAGNOSIS — R918 Other nonspecific abnormal finding of lung field: Secondary | ICD-10-CM

## 2017-04-30 NOTE — Telephone Encounter (Signed)
-----   Message from Tonny BollmanMichael Cooper, MD sent at 04/26/2017  9:14 PM EDT ----- Addendum noted. Recommend 3 month FU scan (noncontrast chest CT) as suggested by radiology. thanks

## 2017-04-30 NOTE — Telephone Encounter (Signed)
Non-contrast CT ordered for scheduling to follow-up on pulmonary nodules. Patient agrees with treatment plan.

## 2017-07-21 ENCOUNTER — Inpatient Hospital Stay: Admission: RE | Admit: 2017-07-21 | Payer: BC Managed Care – PPO | Source: Ambulatory Visit

## 2017-07-29 ENCOUNTER — Inpatient Hospital Stay: Admission: RE | Admit: 2017-07-29 | Payer: BC Managed Care – PPO | Source: Ambulatory Visit

## 2017-09-26 IMAGING — MR MR MRA HEAD W/O CM
1 series · 19 of 48 positions shown · non-contrast
Comparison: Brain MRI 5477 hours today. Head CT without contrast
6967 hours.

CLINICAL DATA: 59-year-old male with small acute right MCA infarcts
today, left upper extremity numbness and difficulty speaking since
4188 hours yesterday. Initial encounter.

EXAM:
MRA HEAD WITHOUT CONTRAST
TECHNIQUE: Angiographic images of the Circle of Willis were obtained using MRA
technique without intravenous contrast.

[Series 11: ax (id) 2 · axial · 1.2mm · 0.43mm/px · z∈[-57,+57]mm · 19 of 200 slices shown]
[im 1/200]
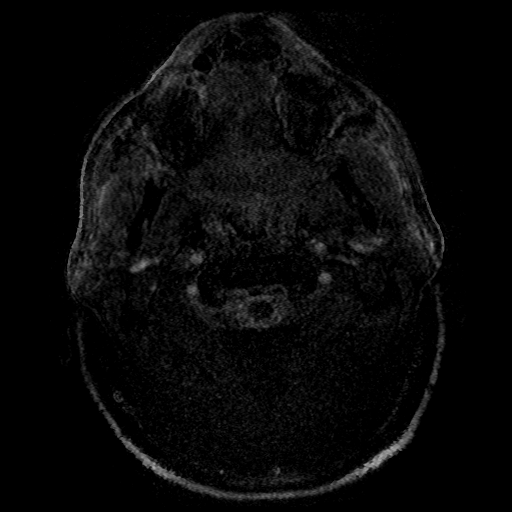
[im 5/200]
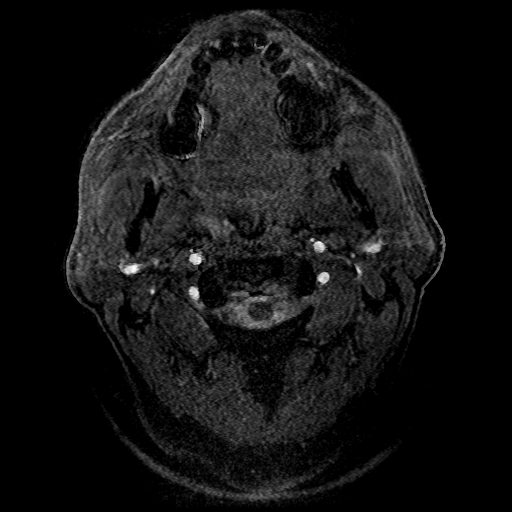
[im 9/200]
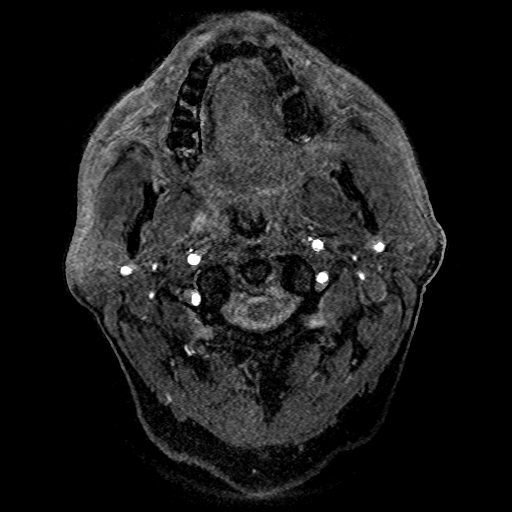
[im 13/200]
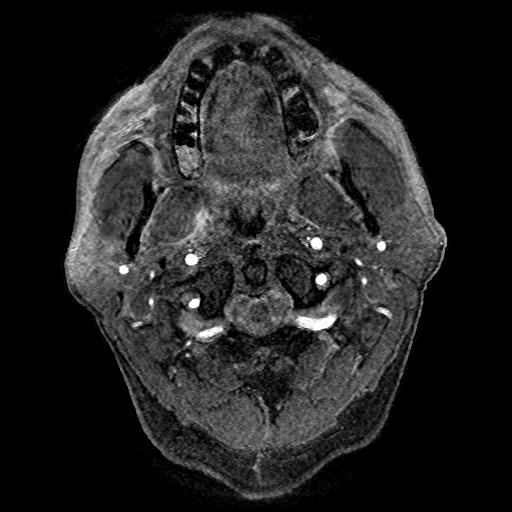
[im 17/200]
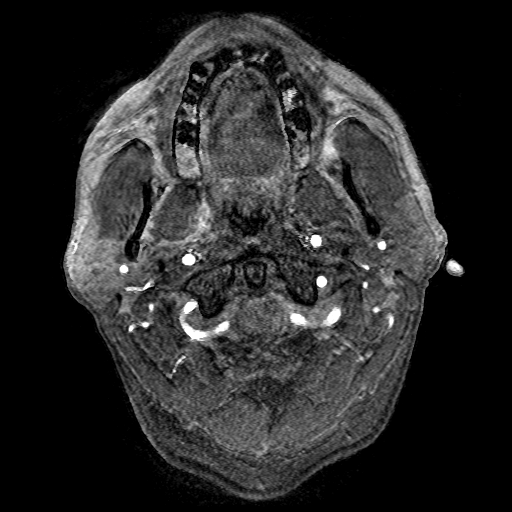
[im 22/200]
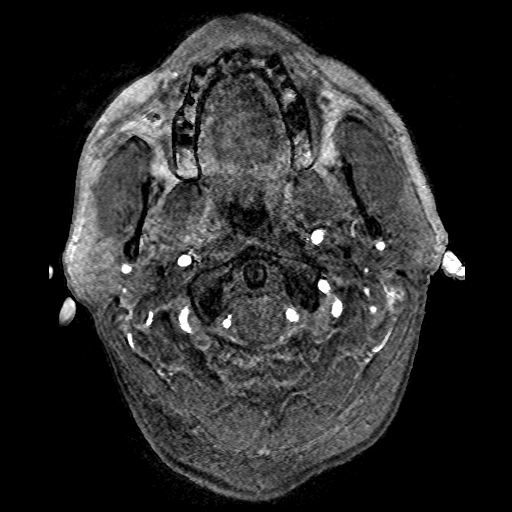
[im 26/200]
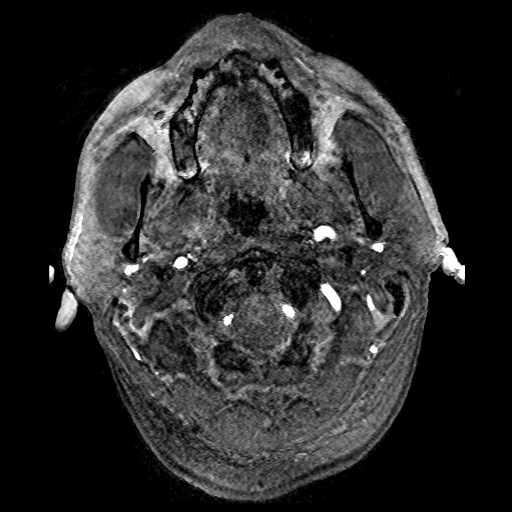
[im 30/200]
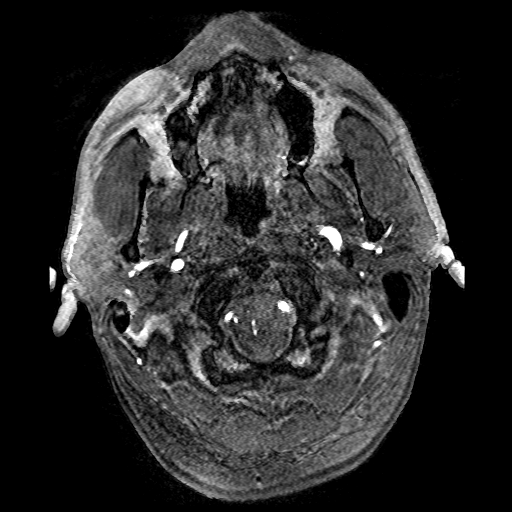
[im 34/200]
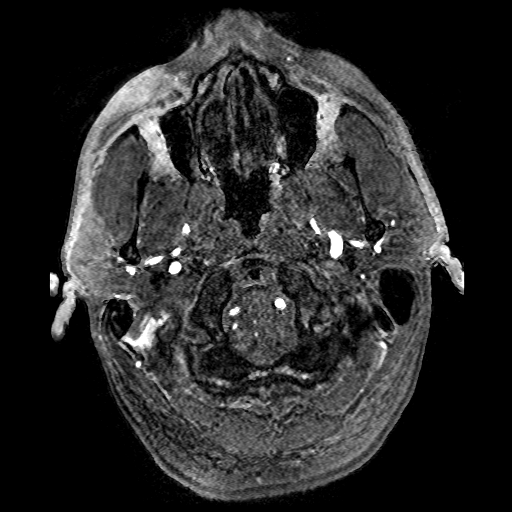
[im 39/200]
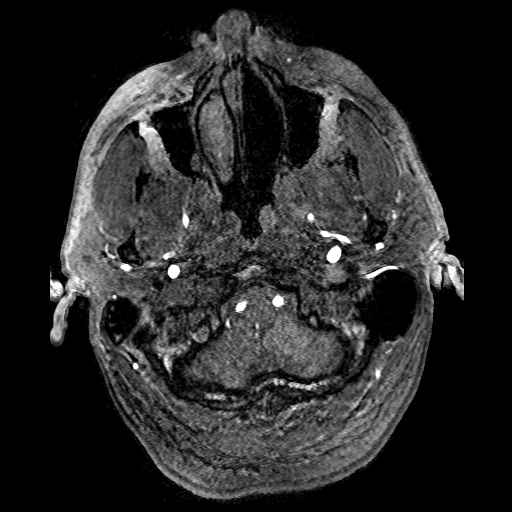
[im 43/200]
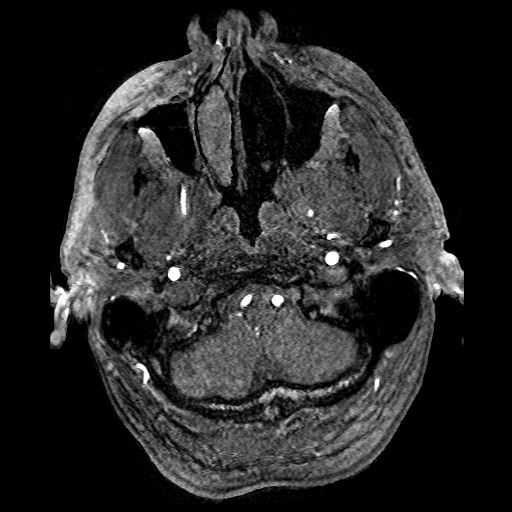
[im 64/200]
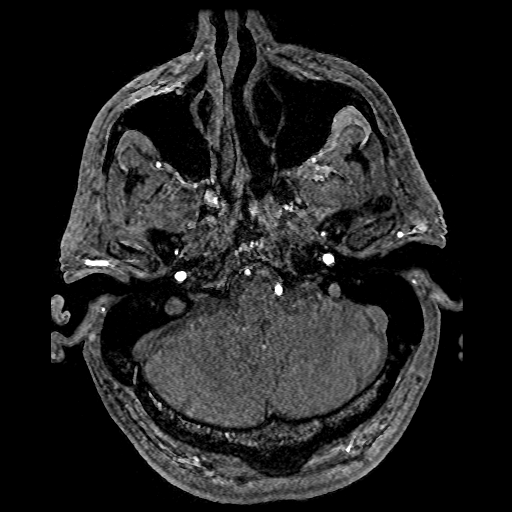
[im 89/200]
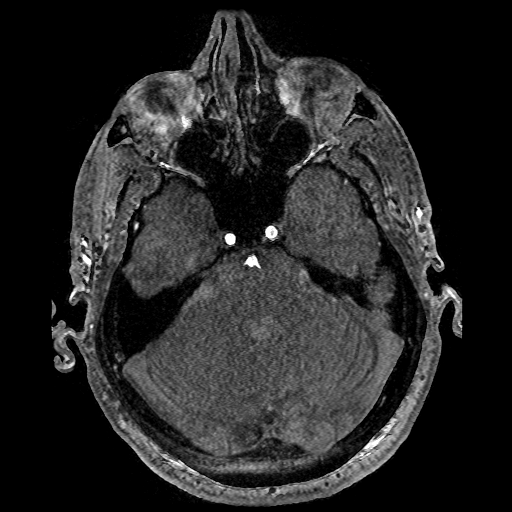
[im 102/200]
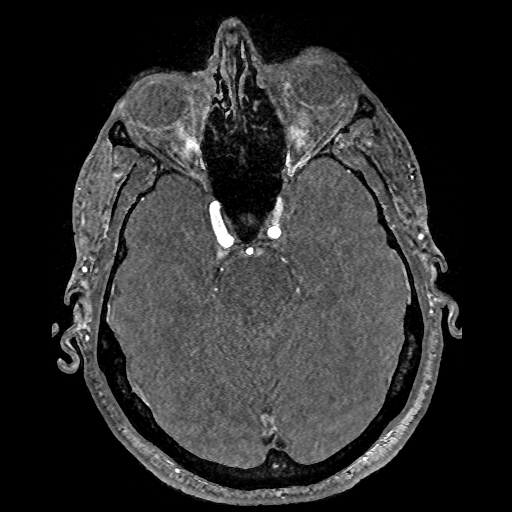
[im 115/200]
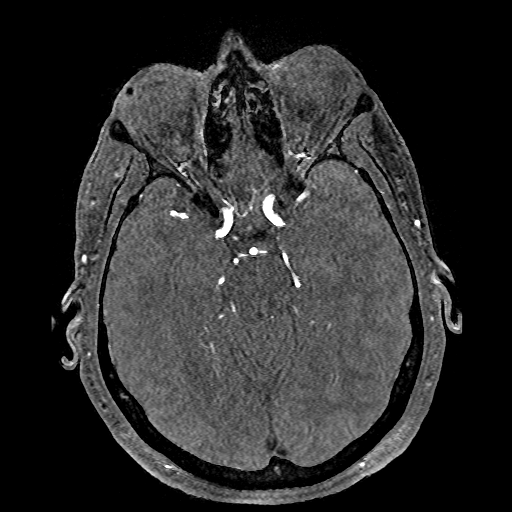
[im 140/200]
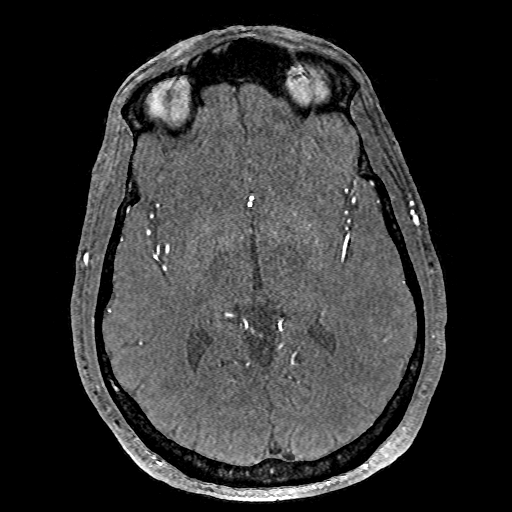
[im 166/200]
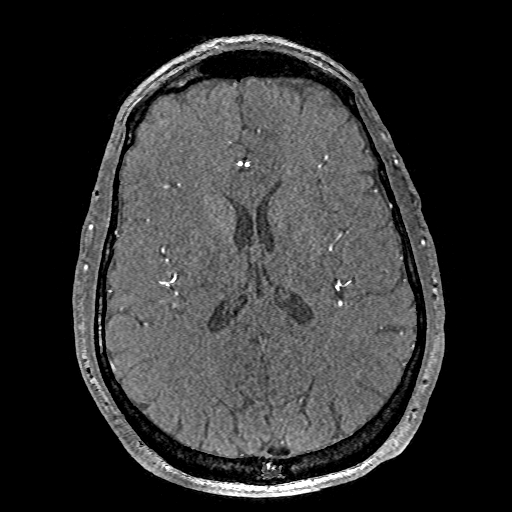
[im 170/200]
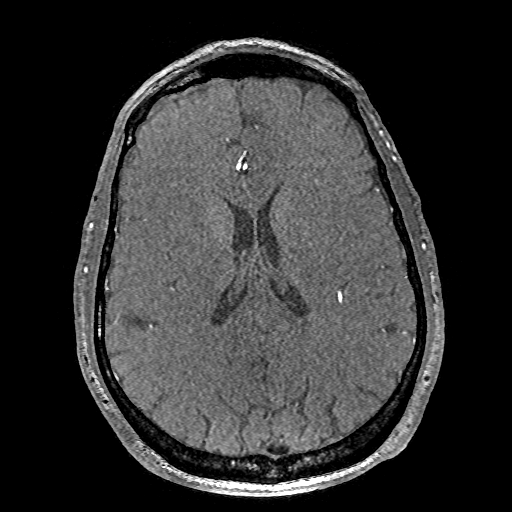
[im 191/200]
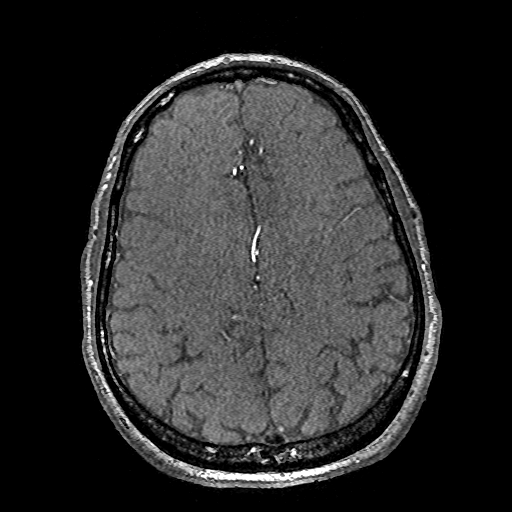

[19 of 48 positions shown; findings below may reference images not displayed]

FINDINGS: Antegrade flow in the posterior circulation with fairly codominant
distal vertebral arteries. Right PICA origin is normal. There is
mild to moderate stenosis of the right V4 segment distal to the PICA
(series 1128, image 7). No distal left vertebral artery stenosis. No
basilar artery stenosis. AICA and SCA origins are within normal
limits. PCA origins are within normal limits. Normal right posterior
communicating artery, the left is diminutive or absent. Bilateral
PCA branches are within normal limits.

Antegrade flow in both ICA siphons. No siphon stenosis. Ophthalmic
and right posterior communicating artery origins are normal. Patent
carotid termini. Normal MCA and ACA origins. Anterior communicating
artery and visualized bilateral ACA branches are within normal
limits. Visualized left MCA branches are within normal limits; early
left MCA bifurcation.

Right MCA origin, M1 segment, and right MCA trifurcation are within
normal limits. Visualized right MCA branches are within normal
limits.
IMPRESSION: 1. Negative anterior circulation. No right MCA branch occlusion or
stenosis identified.
2. Mild to moderate distal right vertebral artery stenosis.
Otherwise negative posterior circulation.

## 2017-10-15 ENCOUNTER — Ambulatory Visit (INDEPENDENT_AMBULATORY_CARE_PROVIDER_SITE_OTHER)
Admission: RE | Admit: 2017-10-15 | Discharge: 2017-10-15 | Disposition: A | Discharge status: Home / Self Care | Payer: BC Managed Care – PPO | Source: Ambulatory Visit | Attending: Cardiovascular Disease | Admitting: Cardiovascular Disease

## 2017-10-15 DIAGNOSIS — R918 Other nonspecific abnormal finding of lung field: Secondary | ICD-10-CM | POA: Diagnosis not present

## 2017-10-18 ENCOUNTER — Telehealth: Payer: Self-pay | Admitting: Cardiovascular Disease

## 2017-10-18 DIAGNOSIS — R9389 Abnormal findings on diagnostic imaging of other specified body structures: Secondary | ICD-10-CM

## 2017-10-18 DIAGNOSIS — R590 Localized enlarged lymph nodes: Secondary | ICD-10-CM

## 2017-10-18 NOTE — Telephone Encounter (Signed)
[facility] inquiring whether we received imaging results.  I verified that we did and that I would forward to you..Marland Kitchen

## 2017-10-18 NOTE — Telephone Encounter (Signed)
New Message   Tracey with Radiology is calling to give results for a patient

## 2017-10-19 ENCOUNTER — Telehealth: Payer: Self-pay | Admitting: *Deleted

## 2017-10-19 NOTE — Telephone Encounter (Signed)
Spoke with Dr. Gustavo LahEnnever's scheduler, who arranged OV with him tomorrow at 1300 [facility]. Informed patient of appointment time/date/location. He understands their office will call to confirm. He was grateful for assistance.

## 2017-10-19 NOTE — Telephone Encounter (Signed)
Message left with pt to inform him of appt with Dr. Myna HidalgoEnnever tomorrow, 10/20/17 at 1:00PM.  Instructed pt to call office with any questions.

## 2017-10-19 NOTE — Telephone Encounter (Signed)
Reviewed recent CT results with Dr. Elberta Fortisamnitz, DOD today.  "IMPRESSION: 1. New bulky supraclavicular, mediastinal and hilar adenopathy, highly worrisome for lymphoma/leukemia. These results will be called to the ordering clinician or representative by the Radiologist Assistant, and communication documented in the PACS or zVision Dashboard."  Hem/onc referral placed for ASAP scheduling. Patient notified and agrees with treatment plan.

## 2017-10-20 ENCOUNTER — Inpatient Hospital Stay (HOSPITAL_BASED_OUTPATIENT_CLINIC_OR_DEPARTMENT_OTHER): Payer: BC Managed Care – PPO | Admitting: Hematology & Oncology

## 2017-10-20 ENCOUNTER — Other Ambulatory Visit (HOSPITAL_COMMUNITY)
Admission: RE | Admit: 2017-10-20 | Discharge: 2017-10-20 | Disposition: A | Discharge status: Home / Self Care | Payer: BC Managed Care – PPO | Source: Ambulatory Visit | Attending: Hematology & Oncology | Admitting: Hematology & Oncology

## 2017-10-20 ENCOUNTER — Inpatient Hospital Stay: Payer: BC Managed Care – PPO | Attending: Hematology & Oncology

## 2017-10-20 VITALS — BP 116/83 | HR 64 | Resp 17 | Wt 207.0 lb

## 2017-10-20 DIAGNOSIS — F329 Major depressive disorder, single episode, unspecified: Secondary | ICD-10-CM | POA: Diagnosis not present

## 2017-10-20 DIAGNOSIS — R918 Other nonspecific abnormal finding of lung field: Secondary | ICD-10-CM | POA: Diagnosis not present

## 2017-10-20 DIAGNOSIS — R590 Localized enlarged lymph nodes: Secondary | ICD-10-CM | POA: Diagnosis not present

## 2017-10-20 DIAGNOSIS — I10 Essential (primary) hypertension: Secondary | ICD-10-CM | POA: Insufficient documentation

## 2017-10-20 DIAGNOSIS — R591 Generalized enlarged lymph nodes: Secondary | ICD-10-CM | POA: Insufficient documentation

## 2017-10-20 LAB — CBC WITH DIFFERENTIAL (CANCER CENTER ONLY)
Basophils Absolute: 0.1 10*3/uL (ref 0.0–0.1)
Basophils Relative: 2 %
Eosinophils Absolute: 0.1 10*3/uL (ref 0.0–0.5)
Eosinophils Relative: 4 %
HCT: 41.6 % (ref 38.7–49.9)
Hemoglobin: 14.3 g/dL (ref 13.0–17.1)
Lymphocytes Relative: 30 %
Lymphs Abs: 1.1 10*3/uL (ref 0.9–3.3)
MCH: 28.4 pg (ref 28.0–33.4)
MCHC: 34.4 g/dL (ref 32.0–35.9)
MCV: 82.7 fL (ref 82.0–98.0)
Monocytes Absolute: 0.5 10*3/uL (ref 0.1–0.9)
Monocytes Relative: 13 %
Neutro Abs: 1.9 10*3/uL (ref 1.5–6.5)
Neutrophils Relative %: 51 %
Platelet Count: 172 10*3/uL (ref 145–400)
RBC: 5.03 MIL/uL (ref 4.20–5.70)
RDW: 13.2 % (ref 11.1–15.7)
WBC Count: 3.7 10*3/uL — ABNORMAL LOW (ref 4.0–10.0)

## 2017-10-20 LAB — CMP (CANCER CENTER ONLY)
ALT: 29 U/L (ref 0–55)
AST: 50 U/L — ABNORMAL HIGH (ref 5–34)
Albumin: 4 g/dL (ref 3.5–5.0)
Alkaline Phosphatase: 45 U/L (ref 40–150)
Anion gap: 9 (ref 3–11)
BUN: 16 mg/dL (ref 7–26)
CO2: 26 mmol/L (ref 22–29)
Calcium: 9.3 mg/dL (ref 8.4–10.4)
Chloride: 104 mmol/L (ref 98–109)
Creatinine: 0.89 mg/dL (ref 0.70–1.30)
GFR, Est AFR Am: >60 mL/min (ref >60)
GFR, Estimated: >60 mL/min (ref >60)
Glucose, Bld: 100 mg/dL (ref 70–140)
Potassium: 3.9 mmol/L (ref 3.5–5.1)
Sodium: 139 mmol/L (ref 136–145)
Total Bilirubin: 0.5 mg/dL (ref 0.2–1.2)
Total Protein: 7.2 g/dL (ref 6.4–8.3)

## 2017-10-20 LAB — SEDIMENTATION RATE: Sed Rate: 4 mm/hr (ref 0–16)

## 2017-10-20 LAB — SAVE SMEAR

## 2017-10-20 NOTE — Progress Notes (Signed)
Referral MD  Reason for Referral: Supraclavicular and mediastinal adenopathy  No chief complaint on file. : I am not sure why I am here.    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  His history actually dates back a couple years ago.  He had a couple mini strokes.  He was found to have a PFO.  He had this closed.  He has been having routine CT scans for follow-up.  He apparently has a thoracic aortic aneurysm.  He has had no problems with chest pain.  He has had no cough.  Is had no weight loss.  He has been exercising.  He has had no sweats.  He has had no rashes.  He had a routine CT scan done.  Shockingly, this showed an increase in adenopathy.  According to the radiologist, he had a "bulky" adenopathy in the supraclavicular regions and mediastinum.  Because of this, he was referred to the [facility] for an evaluation.  He has not traveled recently.  He went to [place] a couple years ago.  He comes in with his wife.  She is an Presenter, broadcasting.  She actually is working up in [place].  He has had no hoarseness.  He has had no swallowing difficulties.  He has had no cough.  There is been no hemoptysis.  The CT scan that was done did not show anything in the upper abdomen.  He had no obvious splenomegaly.  He had no hepatomegaly.  There was no issues with his pancreas.  He does have some small undefined pulmonary nodules.  He does not smoke.  He drinks socially.  He has a identical twin that also has a PFO.  Currently, I would say that his performance status is ECOG 0.    Past Medical History:  Diagnosis Date  . Cluster headaches    "q couple years" (07/15/2015)  . Depression    "mood stabilizer"  . Hypercholesterolemia   . Hypertension   . PFO with atrial septal aneurysm 07/15/2015   s/p 35 mm Amplatzer PFO device by Dr. Excell Seltzer 07/15/2015  .  Stroke [facility]) 11/2014; 05/2015   denies residual from either on 07/15/2015  :  Past Surgical History:  Procedure Laterality Date  . CARDIAC CATHETERIZATION N/A 07/15/2015   Procedure: PFO Closure;  Surgeon: Tonny Bollman, MD;  Location: [facility] INVASIVE CV LAB;  Service: Cardiovascular;  Laterality: N/A;  . KNEE ARTHROSCOPY Right 2013   "meniscus repair"  . PATENT FORAMEN OVALE CLOSURE  07/15/2015  . SHOULDER OPEN ROTATOR CUFF REPAIR Right 1990  :   Current Outpatient Medications:  .  amLODipine (NORVASC) 2.5 MG tablet, Take 2.5 mg by mouth daily., Disp: , Rfl:  .  aspirin EC 81 MG tablet, Take 1 tablet (81 mg total) by mouth daily., Disp: 30 tablet, Rfl: 0 .  atorvastatin (LIPITOR) 20 MG tablet, Take 20 mg by mouth daily. , Disp: , Rfl: 7 .  methylPREDNISolone (MEDROL DOSEPAK) 4 MG TBPK tablet, follow package directions (Patient not taking: Reported on 10/20/2017), Disp: 21 tablet, Rfl: 6 .  PARoxetine (PAXIL) 10 MG tablet, Take 10 mg by mouth daily. , Disp: , Rfl: 0 .  PRESCRIPTION MEDICATION, Place 1 spray into the nose as directed. Lidocaine nasal spray 4% compounded. I spray in affected nostril every 1 hr as directed, Disp: , Rfl:  .  SUMAtriptan (  IMITREX) 50 MG tablet, Take 1 tablet by mouth as directed. May repeat once in 2 hours. Do not exceed 200 mg per day., Disp: , Rfl:  .  SUMAtriptan Succinate (ZEMBRACE SYMTOUCH) 3 MG/0.5ML SOAJ, Inject 3 mg into the skin once as needed. May repeat in 15 minutes. If symptoms persist, repeat in 2 hours. Max 4 injections daily. (Patient not taking: Reported on 10/20/2017), Disp: 8 pen, Rfl: 11 .  triamterene-hydrochlorothiazide (MAXZIDE-25) 37.5-25 MG tablet, Take 1 tablet by mouth daily., Disp: , Rfl: 0:  :  No Known Allergies:  Family History  Problem Relation Age of Onset  . Headache Father   :  Social History   Socioeconomic History  . Marital status: Married    Spouse name: Alona Bene  . Number of children: 1  . Years of education: PhD  .  Highest education level: Not on file  Occupational History  . Occupation: [employer]  Social Needs  . Financial resource strain: Not on file  . Food insecurity:    Worry: Not on file    Inability: Not on file  . Transportation needs:    Medical: Not on file    Non-medical: Not on file  Tobacco Use  . Smoking status: Never Smoker  . Smokeless tobacco: Never Used  Substance and Sexual Activity  . Alcohol use: Yes    Alcohol/week: 3.0 oz    Types: 5 Cans of beer per week  . Drug use: No  . Sexual activity: Not Currently  Lifestyle  . Physical activity:    Days per week: Not on file    Minutes per session: Not on file  . Stress: Not on file  Relationships  . Social connections:    Talks on phone: Not on file    Gets together: Not on file    Attends religious service: Not on file    Active member of club or organization: Not on file    Attends meetings of clubs or organizations: Not on file    Relationship status: Not on file  . Intimate partner violence:    Fear of current or ex partner: Not on file    Emotionally abused: Not on file    Physically abused: Not on file    Forced sexual activity: Not on file  Other Topics Concern  . Not on file  Social History Narrative   Lives with wife Williemae [place] from [place]. Moved about 5 years ago   Caffeine use: daily  :  Review of Systems  Constitutional: Negative.   HENT: Negative.   Eyes: Negative.   Respiratory: Negative.   Cardiovascular: Negative.   Gastrointestinal: Negative.   Genitourinary: Negative.   Musculoskeletal: Negative.   Skin: Negative.   Neurological: Negative.   Endo/Heme/Allergies: Negative.   Psychiatric/Behavioral: Negative.      Exam: Well-developed and well-nourished white male in no obvious distress.  Vital signs show a temperature of 98.3.  Pulse 78.  Blood pressure 116/83.  Weight is 207 pounds.  Head neck exam shows no ocular or oral lesions.  He has no palpable cervical or supraclavicular lymph  nodes.  Lungs are clear bilaterally.  Cardiac exam regular rate and rhythm with no murmurs, rubs or bruits.  Axillary exam shows no bilateral axillary adenopathy.  Abdomen is soft.  He has good bowel sounds.  There is no fluid wave.  There is no palpable liver or spleen tip.  Back exam shows no tenderness over the spine, ribs or hips.  Extremities shows no clubbing, cyanosis or edema.  Neurological exam shows no focal neurological deficit.  Skin exam shows no rashes, ecchymoses or petechia.  I see no suspicious hyperpigmented lesions.   @IPVITALS @   Recent Labs    10/20/17 1344  WBC 3.7*  HGB 14.3  HCT 41.6  PLT 172   No results for input(s): NA, K, CL, CO2, GLUCOSE, BUN, CREATININE, CALCIUM in the last 72 hours.  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    He has supraclavicular and thoracic adenopathy.  I cannot feel anything on his exam.  I reviewed the CT scan with he and his wife.  I think a PET scan will help us out.  Ultimately, he will need to have a biopsy.  This will had to be an excisional biopsy so that we can give enough material for all the genetic markers that we use if this is malignant.  I am not convinced that this is malignant.  I would have to worry about this possibly being sarcoidosis.  This would be a possibility.  Again, the only way that we would know this is for a biopsy.  I suspect that he will need a mediastinoscopy.  Possibly a VATS if "push comes to shove."   Again, I am not convinced that we are looking at a malignancy.  If this is malignancy, then I would think that it probably might be a low-grade lymphoma.  I spent about 50 minutes with he and his wife.  Over half the time was spent face-to-face.  I reviewed the CT scans with them.  I reviewed the labs that we have back with them.  I went over my recommendations.  I answered all their questions.  They agree with my  plan.

## 2017-10-21 LAB — ANTINUCLEAR ANTIBODIES, IFA: ANA Ab, IFA: NEGATIVE

## 2017-10-21 LAB — BETA 2 MICROGLOBULIN, SERUM: Beta-2 Microglobulin: 1.7 mg/L (ref 0.6–2.4)

## 2017-10-21 LAB — LACTATE DEHYDROGENASE: LDH: 195 U/L (ref 125–245)

## 2017-10-21 LAB — RHEUMATOID FACTOR: Rhuematoid fact SerPl-aCnc: <10 IU/mL (ref 0.0–13.9)

## 2017-10-22 ENCOUNTER — Encounter: Payer: Self-pay | Admitting: Hematology & Oncology

## 2017-10-25 LAB — FLOW CYTOMETRY

## 2017-10-27 ENCOUNTER — Other Ambulatory Visit: Payer: Self-pay | Admitting: *Deleted

## 2017-10-27 DIAGNOSIS — R591 Generalized enlarged lymph nodes: Secondary | ICD-10-CM

## 2017-11-10 ENCOUNTER — Encounter: Payer: BC Managed Care – PPO | Admitting: Thoracic Surgery (Cardiothoracic Vascular Surgery)

## 2017-11-11 ENCOUNTER — Other Ambulatory Visit: Payer: Self-pay | Admitting: *Deleted

## 2017-11-11 ENCOUNTER — Institutional Professional Consult (permissible substitution): Payer: BC Managed Care – PPO | Admitting: Thoracic Surgery (Cardiothoracic Vascular Surgery)

## 2017-11-11 VITALS — BP 115/85 | HR 80 | Resp 20 | Ht 72.0 in | Wt 200.0 lb

## 2017-11-11 DIAGNOSIS — R590 Localized enlarged lymph nodes: Secondary | ICD-10-CM

## 2017-11-11 NOTE — Progress Notes (Addendum)
PCP is Darrow Bussing, MD Referring Provider is Josph Macho, MD  Chief Complaint  Patient presents with  . Adenopathy    Surgical eval on supraclavicular and thoracic adenopathy, Chest CT 10/15/17, Pet Scan pending    HPI: Troy [name] sent for consultation regarding mediastinal adenopathy.  Troy [name] is a 62 year old man with a past history of stroke secondary to PFO, PFO closure, hypertension, hyperlipidemia, depression, and migraines.  Troy [name] also has a known ascending aortic aneurysm.  Troy [name] first was noted to have a dilated aorta on echocardiogram.  In October Dr. Excell Seltzer ordered a CT of the chest which showed a 4.3 cm thoracic aneurysm.  Troy [name] had a repeat scan in April for follow-up.  The aneurysm was measured at 4.8 cm (grossly stable by report).  Troy [name] was noted to have mediastinal and supraclavicular adenopathy.  The mediastinal adenopathy was much more notable with a right paratracheal node and subcarinal nodes measuring 2.2 cm.  Troy [name] was referred to Dr. Myna Hidalgo.  A PET/CT was denied by insurance.  Troy [name] now sent for consideration for biopsy.  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  Troy [name] has no significant deficits related to his previous strokes.  Troy [name] denies fevers, chills, night sweats, fatigue, change in appetite, or weight loss.  Troy [name] is anxious to have this procedure done as Troy [name] is planning to ride a motorcycle to [place] for the summer.  Zubrod Score: At the time of surgery this patient's most appropriate activity status/level should be described as:     0    Normal activity, no symptoms     1    Restricted in physical strenuous activity but ambulatory, able to do out light work     2    Ambulatory and capable of self care, unable to do work activities, up and about >50 % of waking hours                                  3    Only limited self care, in bed greater than 50% of waking hours     4    Completely disabled, no self care, confined to bed or chair     5     Moribund  Past Medical History:  Diagnosis Date  . Cluster headaches    "q couple years" (07/15/2015)  . Depression    "mood stabilizer"  . Hypercholesterolemia   . Hypertension   . PFO with atrial septal aneurysm 07/15/2015   s/p 35 mm Amplatzer PFO device by Dr. Excell Seltzer 07/15/2015  . Stroke [facility]) 11/2014; 05/2015   denies residual from either on 07/15/2015    Past Surgical History:  Procedure Laterality Date  . CARDIAC CATHETERIZATION N/A 07/15/2015   Procedure: PFO Closure;  Surgeon: Tonny Bollman, MD;  Location: [facility] INVASIVE CV LAB;  Service: Cardiovascular;  Laterality: N/A;  . KNEE ARTHROSCOPY Right 2013   "meniscus repair"  . PATENT FORAMEN OVALE CLOSURE  07/15/2015  . SHOULDER OPEN ROTATOR CUFF REPAIR Right 1990    Family History  Problem Relation Age of Onset  . Headache Father     Social History Social History   Tobacco Use  . Smoking status: Never Smoker  . Smokeless tobacco: Never Used  Substance Use Topics  . Alcohol use: Yes    Alcohol/week: 3.0 oz    Types: 5 Cans of beer per week  . Drug use: No  Current Outpatient Medications  Medication Sig Dispense Refill  . amLODipine (NORVASC) 2.5 MG tablet Take 2.5 mg by mouth daily.    . aspirin EC 81 MG tablet Take 1 tablet (81 mg total) by mouth daily. 30 tablet 0  . atorvastatin (LIPITOR) 20 MG tablet Take 20 mg by mouth daily.   7  . methylPREDNISolone (MEDROL DOSEPAK) 4 MG TBPK tablet follow package directions (Patient not taking: Reported on 10/20/2017) 21 tablet 6  . PARoxetine (PAXIL) 10 MG tablet Take 10 mg by mouth daily.   0  . PRESCRIPTION MEDICATION Place 1 spray into the nose as directed. Lidocaine nasal spray 4% compounded. I spray in affected nostril every 1 hr as directed    . SUMAtriptan (IMITREX) 50 MG tablet Take 1 tablet by mouth as directed. May repeat once in 2 hours. Do not exceed 200 mg per day.    . SUMAtriptan Succinate (ZEMBRACE SYMTOUCH) 3 MG/0.5ML SOAJ Inject 3 mg into the skin once as  needed. May repeat in 15 minutes. If symptoms persist, repeat in 2 hours. Max 4 injections daily. (Patient not taking: Reported on 10/20/2017) 8 pen 11  . triamterene-hydrochlorothiazide (MAXZIDE-25) 37.5-25 MG tablet Take 1 tablet by mouth daily.  0   No current facility-administered medications for this visit.     No Known Allergies  Review of Systems  Constitutional: Negative for activity change, appetite change, chills, diaphoresis, fatigue, fever and unexpected weight change.  HENT: Negative for trouble swallowing and voice change.   Eyes: Positive for visual disturbance (Recent cataract surgery).  Respiratory: Negative for chest tightness and shortness of breath.   Cardiovascular: Negative for chest pain.  Gastrointestinal: Negative for abdominal distention and abdominal pain.  Genitourinary: Negative for difficulty urinating and dysuria.  Musculoskeletal: Negative for arthralgias and myalgias.  Neurological: Positive for speech difficulty (Slurred with stroke previously) and headaches. Negative for weakness.  Hematological: Positive for adenopathy (By CT). Does not bruise/bleed easily.    BP 115/85   Pulse 80   Resp 20   Ht 6' (1.829 m)   Wt 200 lb (90.7 kg)   SpO2 97% Comment: RA  BMI 27.12 kg/m  Physical Exam  Constitutional: Troy [name] is oriented to person, place, and time. Troy [name] appears well-developed and well-nourished. No distress.  HENT:  Head: Normocephalic and atraumatic.  Wearing sunglasses due to recent cataract surgery  Neck: Neck supple. No thyromegaly present.  Cardiovascular: Normal rate, regular rhythm and normal heart sounds. Exam reveals no friction rub.  No murmur heard. Pulmonary/Chest: Effort normal and breath sounds normal. No stridor. No respiratory distress. Troy [name] has no wheezes.  Abdominal: Soft. Troy [name] exhibits no distension. There is no tenderness.  Musculoskeletal: Troy [name] exhibits no edema or deformity.  Lymphadenopathy:    Troy [name] has no cervical adenopathy.   Neurological: Troy [name] is alert and oriented to person, place, and time. No cranial nerve deficit. Troy [name] exhibits normal muscle tone. Coordination normal.  Skin: Skin is warm and dry.  Vitals reviewed.    Diagnostic Tests: CT CHEST WITHOUT CONTRAST  TECHNIQUE: Multidetector CT imaging of the chest was performed following the standard protocol without IV contrast.  COMPARISON:  04/13/2017.  FINDINGS: Cardiovascular: Ascending aorta measures 4.8 cm, grossly stable when compared with CTA 04/13/2017. PFO closure device. Heart size normal. No pericardial effusion.  Mediastinum/Nodes: Marked increase in size and number of supraclavicular and mediastinal lymph nodes. Low left internal jugular lymph node measures 9 mm. Index low right paratracheal lymph node measures 2.2 cm, previously 9 mm. Subcarinal   lymph node measures 2.2 cm, previously 9 mm. Hilar regions are difficult to evaluate without IV contrast but do appear enlarged. Index right hilar lymph node measures 1.7 cm. No axillary adenopathy. Esophagus is grossly unremarkable.  Lungs/Pleura: 4 mm subpleural right middle lobe nodule (series 3, image 100), previously 2-3 mm. Lingular nodules measure up to 6 mm, similar. No pleural fluid. Airway is unremarkable.  Upper Abdomen: Visualized portions of the liver, gallbladder, adrenal glands, kidneys, spleen, pancreas, stomach and bowel are grossly unremarkable. No upper abdominal adenopathy.  Musculoskeletal: Degenerative changes in the spine. No worrisome lytic or sclerotic lesions. Postoperative changes in the proximal right humerus with degenerative changes in both shoulders.  IMPRESSION: 1. New bulky supraclavicular, mediastinal and hilar adenopathy, highly worrisome for lymphoma/leukemia. These results will be called to the ordering clinician or representative by the Radiologist Assistant, and communication documented in the PACS or zVision Dashboard. 2. Aortic aneurysm  NOS (ICD10-I71.9). Ascending aortic aneurysm, grossly stable. Ascending thoracic aortic aneurysm. Recommend semi-annual imaging followup by CTA or MRA and referral to cardiothoracic surgery if not already obtained. This recommendation follows 2010 ACCF/AHA/AATS/ACR/ASA/SCA/SCAI/SIR/STS/SVM Guidelines for the Diagnosis and Management of Patients With Thoracic Aortic Disease. Circulation. 2010; 121: e266-e369. 3. 4 mm subpleural right middle lobe nodule appears slightly larger. Additional pulmonary nodules are stable. Continued attention on follow-up exams is warranted.   Electronically Signed   By: Melinda  Blietz M.D.   On: 10/16/2017 10:00 I personally reviewed the CT images and concur with the findings of adenopathy.  Looking at the aorta I think the true diameter of the aorta is more like 4.5 to 4.6 cm I think it was over read on the CT and under read on the previous CT I think there has been minimal if any change between the scans in October and April.  Impression: Troy [name] is a 61-year-old non-smoker who was incidentally found to have mediastinal adenopathy on a CT of the chest done for follow-up of an ascending aneurysm.  Troy [name] is asymptomatic without fevers, chills, night sweats, weight loss, malaise, chest pain, and shortness of breath.  Differential diagnosis includes infection, sarcoidosis, and malignancy.  Most likely this is either sarcoidosis or a low-grade lymphoma.  Troy [name] did spend 3 weeks in the bush in [place] last year on a archaeological project.  It does raise the possibility of some unusual infection, although I think Troy [name] would have systemic symptoms if that were the case.  In any event Troy [name] needs a tissue diagnosis to guide treatment.  In my opinion the best option is mediastinoscopy.  His supraclavicular nodes are not remarkable on the CT.  I also do not feel any significant adenopathy on exam.  His mediastinal nodes are markedly enlarged and relatively easy to approach.  I  described the procedure mediastinoscopy to Mr. and Mrs. Ocon.  They understand this will be done in the operating room under general anesthesia.  We would plan to do it is an outpatient procedure.  I informed him of the indications, risks, benefits, and alternatives.  Troy [name] understands the risks include, but are not limited to death, MI, DVT, PE, stroke, bleeding, possible need for transfusion, infection, pneumothorax, recurrent nerve injury, as well as possibility of other unforeseeable complications.  Troy [name] accepts the risks and wishes to proceed.  Thoracic aortic aneurysm-4.5 to 4.6 cm ascending aneurysm.  Relatively normal root.  Tricuspid aortic valve.  Needs follow-up with CT or MR at 6-month intervals.  Hypertension-blood pressure well controlled  Plan: Mediastinoscopy on Monday, 11/15/2017    Melrose Nakayama, MD [facility] [phone]

## 2017-11-11 NOTE — H&P (View-Only) (Signed)
PCP is Troy Bussing, MD Referring Provider is Troy Macho, MD  Chief Complaint  Patient presents with  . Adenopathy    Surgical eval on supraclavicular and thoracic adenopathy, Chest CT 10/15/17, Pet Scan pending    HPI: Troy [name] sent for consultation regarding mediastinal adenopathy.  Troy [name] is a 62 year old man with a past history of stroke secondary to PFO, PFO closure, hypertension, hyperlipidemia, depression, and migraines.  Troy [name] also has a known ascending aortic aneurysm.  Troy [name] first was noted to have a dilated aorta on echocardiogram.  In October Troy [name] ordered a CT of the chest which showed a 4.3 cm thoracic aneurysm.  Troy [name] had a repeat scan in April for follow-up.  The aneurysm was measured at 4.8 cm (grossly stable by report).  Troy [name] was noted to have mediastinal and supraclavicular adenopathy.  The mediastinal adenopathy was much more notable with a right paratracheal node and subcarinal nodes measuring 2.2 cm.  Troy [name] was referred to Troy [name].  A PET/CT was denied by insurance.  Troy [name] now sent for consideration for biopsy.  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  Troy [name] has no significant deficits related to his previous strokes.  Troy [name] denies fevers, chills, night sweats, fatigue, change in appetite, or weight loss.  Troy [name] is anxious to have this procedure done as Troy [name] is planning to ride a motorcycle to [place] for the summer.  Zubrod Score: At the time of surgery this patient's most appropriate activity status/level should be described as:     0    Normal activity, no symptoms     1    Restricted in physical strenuous activity but ambulatory, able to do out light work     2    Ambulatory and capable of self care, unable to do work activities, up and about >50 % of waking hours                                  3    Only limited self care, in bed greater than 50% of waking hours     4    Completely disabled, no self care, confined to bed or chair     5     Moribund  Past Medical History:  Diagnosis Date  . Cluster headaches    "q couple years" (07/15/2015)  . Depression    "mood stabilizer"  . Hypercholesterolemia   . Hypertension   . PFO with atrial septal aneurysm 07/15/2015   s/p 35 mm Amplatzer PFO device by Troy [name] 07/15/2015  . Stroke [facility]) 11/2014; 05/2015   denies residual from either on 07/15/2015    Past Surgical History:  Procedure Laterality Date  . CARDIAC CATHETERIZATION N/A 07/15/2015   Procedure: PFO Closure;  Surgeon: Tonny Bollman, MD;  Location: [facility] INVASIVE CV LAB;  Service: Cardiovascular;  Laterality: N/A;  . KNEE ARTHROSCOPY Right 2013   "meniscus repair"  . PATENT FORAMEN OVALE CLOSURE  07/15/2015  . SHOULDER OPEN ROTATOR CUFF REPAIR Right 1990    Family History  Problem Relation Age of Onset  . Headache Father     Social History Social History   Tobacco Use  . Smoking status: Never Smoker  . Smokeless tobacco: Never Used  Substance Use Topics  . Alcohol use: Yes    Alcohol/week: 3.0 oz    Types: 5 Cans of beer per week  . Drug use: No  Current Outpatient Medications  Medication Sig Dispense Refill  . amLODipine (NORVASC) 2.5 MG tablet Take 2.5 mg by mouth daily.    Marland Kitchen aspirin EC 81 MG tablet Take 1 tablet (81 mg total) by mouth daily. 30 tablet 0  . atorvastatin (LIPITOR) 20 MG tablet Take 20 mg by mouth daily.   7  . methylPREDNISolone (MEDROL DOSEPAK) 4 MG TBPK tablet follow package directions (Patient not taking: Reported on 10/20/2017) 21 tablet 6  . PARoxetine (PAXIL) 10 MG tablet Take 10 mg by mouth daily.   0  . PRESCRIPTION MEDICATION Place 1 spray into the nose as directed. Lidocaine nasal spray 4% compounded. I spray in affected nostril every 1 hr as directed    . SUMAtriptan (IMITREX) 50 MG tablet Take 1 tablet by mouth as directed. May repeat once in 2 hours. Do not exceed 200 mg per day.    . SUMAtriptan Succinate (ZEMBRACE SYMTOUCH) 3 MG/0.5ML SOAJ Inject 3 mg into the skin once as  needed. May repeat in 15 minutes. If symptoms persist, repeat in 2 hours. Max 4 injections daily. (Patient not taking: Reported on 10/20/2017) 8 pen 11  . triamterene-hydrochlorothiazide (MAXZIDE-25) 37.5-25 MG tablet Take 1 tablet by mouth daily.  0   No current facility-administered medications for this visit.     No Known Allergies  Review of Systems  Constitutional: Negative for activity change, appetite change, chills, diaphoresis, fatigue, fever and unexpected weight change.  HENT: Negative for trouble swallowing and voice change.   Eyes: Positive for visual disturbance (Recent cataract surgery).  Respiratory: Negative for chest tightness and shortness of breath.   Cardiovascular: Negative for chest pain.  Gastrointestinal: Negative for abdominal distention and abdominal pain.  Genitourinary: Negative for difficulty urinating and dysuria.  Musculoskeletal: Negative for arthralgias and myalgias.  Neurological: Positive for speech difficulty (Slurred with stroke previously) and headaches. Negative for weakness.  Hematological: Positive for adenopathy (By CT). Does not bruise/bleed easily.    BP 115/85   Pulse 80   Resp 20   Ht 6' (1.829 m)   Wt 200 lb (90.7 kg)   SpO2 97% Comment: RA  BMI 27.12 kg/m  Physical Exam  Constitutional: Troy [name] is oriented to person, place, and time. Troy [name] appears well-developed and well-nourished. No distress.  HENT:  Head: Normocephalic and atraumatic.  Wearing sunglasses due to recent cataract surgery  Neck: Neck supple. No thyromegaly present.  Cardiovascular: Normal rate, regular rhythm and normal heart sounds. Exam reveals no friction rub.  No murmur heard. Pulmonary/Chest: Effort normal and breath sounds normal. No stridor. No respiratory distress. Troy [name] has no wheezes.  Abdominal: Soft. Troy [name] exhibits no distension. There is no tenderness.  Musculoskeletal: Troy [name] exhibits no edema or deformity.  Lymphadenopathy:    Troy [name] has no cervical adenopathy.   Neurological: Troy [name] is alert and oriented to person, place, and time. No cranial nerve deficit. Troy [name] exhibits normal muscle tone. Coordination normal.  Skin: Skin is warm and dry.  Vitals reviewed.    Diagnostic Tests: CT CHEST WITHOUT CONTRAST  TECHNIQUE: Multidetector CT imaging of the chest was performed following the standard protocol without IV contrast.  COMPARISON:  04/13/2017.  FINDINGS: Cardiovascular: Ascending aorta measures 4.8 cm, grossly stable when compared with CTA 04/13/2017. PFO closure device. Heart size normal. No pericardial effusion.  Mediastinum/Nodes: Marked increase in size and number of supraclavicular and mediastinal lymph nodes. Low left internal jugular lymph node measures 9 mm. Index low right paratracheal lymph node measures 2.2 cm, previously 9 mm. Subcarinal  lymph node measures 2.2 cm, previously 9 mm. Hilar regions are difficult to evaluate without IV contrast but do appear enlarged. Index right hilar lymph node measures 1.7 cm. No axillary adenopathy. Esophagus is grossly unremarkable.  Lungs/Pleura: 4 mm subpleural right middle lobe nodule (series 3, image 100), previously 2-3 mm. Lingular nodules measure up to 6 mm, similar. No pleural fluid. Airway is unremarkable.  Upper Abdomen: Visualized portions of the liver, gallbladder, adrenal glands, kidneys, spleen, pancreas, stomach and bowel are grossly unremarkable. No upper abdominal adenopathy.  Musculoskeletal: Degenerative changes in the spine. No worrisome lytic or sclerotic lesions. Postoperative changes in the proximal right humerus with degenerative changes in both shoulders.  IMPRESSION: 1. New bulky supraclavicular, mediastinal and hilar adenopathy, highly worrisome for lymphoma/leukemia. These results will be called to the ordering clinician or representative by the Radiologist Assistant, and communication documented in the PACS or zVision Dashboard. 2. Aortic aneurysm  NOS (ICD10-I71.9). Ascending aortic aneurysm, grossly stable. Ascending thoracic aortic aneurysm. Recommend semi-annual imaging followup by CTA or MRA and referral to cardiothoracic surgery if not already obtained. This recommendation follows 2010 ACCF/AHA/AATS/ACR/ASA/SCA/SCAI/SIR/STS/SVM Guidelines for the Diagnosis and Management of Patients With Thoracic Aortic Disease. Circulation. 2010; 121: e266-e369. 3. 4 mm subpleural right middle lobe nodule appears slightly larger. Additional pulmonary nodules are stable. Continued attention on follow-up exams is warranted.   Electronically Signed   By: Leanna Battles M.D.   On: 10/16/2017 10:00 I personally reviewed the CT images and concur with the findings of adenopathy.  Looking at the aorta I think the true diameter of the aorta is more like 4.5 to 4.6 cm I think it was over read on the CT and under read on the previous CT I think there has been minimal if any change between the scans in October and April.  Impression: TroyBale is a 62 year old non-smoker who was incidentally found to have mediastinal adenopathy on a CT of the chest done for follow-up of an ascending aneurysm.  Troy [name] is asymptomatic without fevers, chills, night sweats, weight loss, malaise, chest pain, and shortness of breath.  Differential diagnosis includes infection, sarcoidosis, and malignancy.  Most likely this is either sarcoidosis or a low-grade lymphoma.  Troy [name] did spend 3 weeks in the bush in [place] last year on a archaeological project.  It does raise the possibility of some unusual infection, although I think Troy [name] would have systemic symptoms if that were the case.  In any event Troy [name] needs a tissue diagnosis to guide treatment.  In my opinion the best option is mediastinoscopy.  His supraclavicular nodes are not remarkable on the CT.  I also do not feel any significant adenopathy on exam.  His mediastinal nodes are markedly enlarged and relatively easy to approach.  I  described the procedure mediastinoscopy to Mr. and Mrs. [name].  They understand this will be done in the operating room under general anesthesia.  We would plan to do it is an outpatient procedure.  I informed him of the indications, risks, benefits, and alternatives.  Troy [name] understands the risks include, but are not limited to death, MI, DVT, PE, stroke, bleeding, possible need for transfusion, infection, pneumothorax, recurrent nerve injury, as well as possibility of other unforeseeable complications.  Troy [name] accepts the risks and wishes to proceed.  Thoracic aortic aneurysm-4.5 to 4.6 cm ascending aneurysm.  Relatively normal root.  Tricuspid aortic valve.  Needs follow-up with CT or MR at 45-month intervals.  Hypertension-blood pressure well controlled  Plan: Mediastinoscopy on Monday, 11/15/2017  Melrose Nakayama, MD [facility] [phone]

## 2017-11-11 NOTE — Pre-Procedure Instructions (Signed)
   Enes Rokosz  11/11/2017   [facility] - [place], [place] - [address] [place] [address] Phone: [phone] Fax: [phone]  [facility] - [place], [place] - [address] [place] [address] Phone: [phone] Fax: [phone]   Your procedure is scheduled on Monday, Nov 15, 2017  Report to [facility] Admitting at 5:30 A.M.  Call this number if you have problems the morning of surgery:  [phone]    Remember: Follow the surgeon's instructions regarding Aspirin  Do not eat food or drink liquids after midnight on Sunday, Nov 14, 2017  Take these medicines the morning of surgery with A SIP OF WATER : amLODipine (NORVASC), PARoxetine (PAXIL), eye drops, nasal spray, if needed: SUMAtriptan (IMITREX) for Migraine headache Stop taking vitamins, fish oil and herbal medications. Do not take any NSAIDs ie: Ibuprofen, Advil, Naproxen (Aleve), Motrin, BC and Goody Powder; stop now.  Do not wear jewelry, make-up or nail polish.  Do not wear lotions, powders, or perfumes, or deodorant.  Do not shave 48 hours prior to surgery.  Men may shave face and neck.  Do not bring valuables to the hospital.  [facility] is not responsible for any belongings or valuables.  Contacts, dentures or bridgework may not be worn into surgery.  Leave your suitcase in the car.  After surgery it may be brought to your room. Special instructions: Shower the night bore and morning of surgery with CHG. Please read over the following fact sheets that you were given. Pain Booklet, Coughing and Deep Breathing, MRSA Information and Surgical Site Infection Prevention

## 2017-11-12 ENCOUNTER — Ambulatory Visit (HOSPITAL_COMMUNITY)
Admission: RE | Admit: 2017-11-12 | Discharge: 2017-11-12 | Disposition: A | Discharge status: Home / Self Care | Payer: BC Managed Care – PPO | Source: Ambulatory Visit | Attending: Thoracic Surgery (Cardiothoracic Vascular Surgery) | Admitting: Thoracic Surgery (Cardiothoracic Vascular Surgery)

## 2017-11-12 ENCOUNTER — Encounter (HOSPITAL_COMMUNITY): Payer: Self-pay

## 2017-11-12 ENCOUNTER — Encounter: Payer: BC Managed Care – PPO | Admitting: Thoracic Surgery (Cardiothoracic Vascular Surgery)

## 2017-11-12 ENCOUNTER — Encounter (HOSPITAL_COMMUNITY)
Admission: RE | Admit: 2017-11-12 | Discharge: 2017-11-12 | Disposition: A | Discharge status: Home / Self Care | Payer: BC Managed Care – PPO | Source: Ambulatory Visit | Attending: Thoracic Surgery (Cardiothoracic Vascular Surgery) | Admitting: Thoracic Surgery (Cardiothoracic Vascular Surgery)

## 2017-11-12 DIAGNOSIS — I4430 Unspecified atrioventricular block: Secondary | ICD-10-CM | POA: Diagnosis not present

## 2017-11-12 DIAGNOSIS — R001 Bradycardia, unspecified: Secondary | ICD-10-CM | POA: Insufficient documentation

## 2017-11-12 DIAGNOSIS — R590 Localized enlarged lymph nodes: Secondary | ICD-10-CM | POA: Insufficient documentation

## 2017-11-12 HISTORY — DX: Fibromyalgia: M79.7

## 2017-11-12 LAB — ABO/RH: ABO/RH(D): A NEG

## 2017-11-12 LAB — COMPREHENSIVE METABOLIC PANEL
ALT: 24 U/L (ref 17–63)
AST: 44 U/L — ABNORMAL HIGH (ref 15–41)
Albumin: 3.7 g/dL (ref 3.5–5.0)
Alkaline Phosphatase: 44 U/L (ref 38–126)
Anion gap: 9 (ref 5–15)
BUN: 16 mg/dL (ref 6–20)
CO2: 25 mmol/L (ref 22–32)
Calcium: 9.3 mg/dL (ref 8.9–10.3)
Chloride: 105 mmol/L (ref 101–111)
Creatinine, Ser: 0.94 mg/dL (ref 0.61–1.24)
GFR calc Af Amer: >60 mL/min (ref >60)
GFR calc non Af Amer: >60 mL/min (ref >60)
Glucose, Bld: 104 mg/dL — ABNORMAL HIGH (ref 65–99)
Potassium: 4 mmol/L (ref 3.5–5.1)
Sodium: 139 mmol/L (ref 135–145)
Total Bilirubin: 0.7 mg/dL (ref 0.3–1.2)
Total Protein: 6.4 g/dL — ABNORMAL LOW (ref 6.5–8.1)

## 2017-11-12 LAB — TYPE AND SCREEN
ABO/RH(D): A NEG
Antibody Screen: NEGATIVE

## 2017-11-12 LAB — CBC
HCT: 42.1 % (ref 39.0–52.0)
Hemoglobin: 14 g/dL (ref 13.0–17.0)
MCH: 27.5 pg (ref 26.0–34.0)
MCHC: 33.3 g/dL (ref 30.0–36.0)
MCV: 82.5 fL (ref 78.0–100.0)
Platelets: 199 10*3/uL (ref 150–400)
RBC: 5.1 MIL/uL (ref 4.22–5.81)
RDW: 12.7 % (ref 11.5–15.5)
WBC: 4.7 10*3/uL (ref 4.0–10.5)

## 2017-11-12 LAB — APTT: aPTT: 33 seconds (ref 24–36)

## 2017-11-12 LAB — PROTIME-INR
INR: 1.03
Prothrombin Time: 13.4 seconds (ref 11.4–15.2)

## 2017-11-12 LAB — SURGICAL PCR SCREEN
MRSA, PCR: NEGATIVE
Staphylococcus aureus: POSITIVE — AB

## 2017-11-12 NOTE — Progress Notes (Signed)
Pt. Notified of results of PCR.  Prescription for Mupirocin called to [facility].

## 2017-11-12 NOTE — Progress Notes (Signed)
Cardiologist   Dr. Tonny Bollman  Last OV Dr. Excell Seltzer 03-29-2017 Echo 03-22-2017 Cath -07-15-2015

## 2017-11-14 NOTE — Anesthesia Preprocedure Evaluation (Addendum)
Anesthesia Evaluation  Patient identified by MRN, date of birth, ID band Patient awake    Reviewed: Allergy & Precautions, NPO status , Patient's Chart, lab work & pertinent test results  Airway Mallampati: II  TM Distance: >3 FB Neck ROM: Full    Dental  (+) Dental Advisory Given   Pulmonary neg pulmonary ROS,    breath sounds clear to auscultation       Cardiovascular hypertension, Pt. on medications  Rhythm:Regular Rate:Normal     Neuro/Psych  Headaches, Depression TIACVA    GI/Hepatic negative GI ROS, Neg liver ROS,   Endo/Other  negative endocrine ROS  Renal/GU negative Renal ROS     Musculoskeletal  (+) Fibromyalgia -  Abdominal   Peds  Hematology negative hematology ROS (+)   Anesthesia Other Findings   Reproductive/Obstetrics                            Lab Results  Component Value Date   WBC 4.7 11/12/2017   HGB 14.0 11/12/2017   HCT 42.1 11/12/2017   MCV 82.5 11/12/2017   PLT 199 11/12/2017   Lab Results  Component Value Date   CREATININE 0.94 11/12/2017   BUN 16 11/12/2017   NA 139 11/12/2017   K 4.0 11/12/2017   CL 105 11/12/2017   CO2 25 11/12/2017   Study Conclusions  - Left ventricle: The cavity size was normal. There was mild focal  basal hypertrophy of the septum. Systolic function was normal. The estimated ejection fraction was in the range of 55% to 60%.Wall motion was normal; there were no regional wall motion  abnormalities. Doppler parameters are consistent with abnormal  left ventricular relaxation (grade 1 diastolic dysfunction). - Aortic valve: Trileaflet; mildly thickened, mildly calcified leaflets. - Aorta: Ascending aortic diameter: 43 mm (S). - Ascending aorta: The ascending aorta was mildly dilated. - Left atrium: The atrium was at the upper limits of normal in size. Volume/bsa, ES, (1-plane Simpson&'s, A2C): 32.4 ml/m^2. - Right ventricle: The  cavity size was mildly dilated. Wall thickness was normal. - Atrial septum: An Amplatzer closure device was present and  functioning normally. No shunt Anesthesia Physical Anesthesia Plan  ASA: II  Anesthesia Plan: General   Post-op Pain Management:    Induction: Intravenous  PONV Risk Score and Plan: 2 and Ondansetron, Dexamethasone and Treatment may vary due to age or medical condition  Airway Management Planned: Oral ETT  Additional Equipment:   Intra-op Plan:   Post-operative Plan: Extubation in OR  Informed Consent: I have reviewed the patients History and Physical, chart, labs and discussed the procedure including the risks, benefits and alternatives for the proposed anesthesia with the patient or authorized representative who has indicated his/her understanding and acceptance.   Dental advisory given  Plan Discussed with: CRNA  Anesthesia Plan Comments:        Anesthesia Quick Evaluation

## 2017-11-15 ENCOUNTER — Ambulatory Visit (HOSPITAL_COMMUNITY)
Admission: RE | Admit: 2017-11-15 | Discharge: 2017-11-15 | Disposition: A | Discharge status: Home / Self Care | Payer: BC Managed Care – PPO | Source: Ambulatory Visit | Attending: Thoracic Surgery (Cardiothoracic Vascular Surgery) | Admitting: Thoracic Surgery (Cardiothoracic Vascular Surgery)

## 2017-11-15 ENCOUNTER — Encounter (HOSPITAL_COMMUNITY)
Admission: RE | Disposition: A | Discharge status: Home / Self Care | Payer: Self-pay | Source: Ambulatory Visit | Attending: Thoracic Surgery (Cardiothoracic Vascular Surgery)

## 2017-11-15 ENCOUNTER — Ambulatory Visit (HOSPITAL_COMMUNITY): Payer: BC Managed Care – PPO | Admitting: Anesthesiology

## 2017-11-15 DIAGNOSIS — Z8673 Personal history of transient ischemic attack (TIA), and cerebral infarction without residual deficits: Secondary | ICD-10-CM | POA: Diagnosis not present

## 2017-11-15 DIAGNOSIS — I10 Essential (primary) hypertension: Secondary | ICD-10-CM | POA: Insufficient documentation

## 2017-11-15 DIAGNOSIS — G44019 Episodic cluster headache, not intractable: Secondary | ICD-10-CM | POA: Insufficient documentation

## 2017-11-15 DIAGNOSIS — E7800 Pure hypercholesterolemia, unspecified: Secondary | ICD-10-CM | POA: Insufficient documentation

## 2017-11-15 DIAGNOSIS — I889 Nonspecific lymphadenitis, unspecified: Secondary | ICD-10-CM | POA: Insufficient documentation

## 2017-11-15 DIAGNOSIS — Z7982 Long term (current) use of aspirin: Secondary | ICD-10-CM | POA: Diagnosis not present

## 2017-11-15 DIAGNOSIS — I712 Thoracic aortic aneurysm, without rupture: Secondary | ICD-10-CM | POA: Insufficient documentation

## 2017-11-15 DIAGNOSIS — F329 Major depressive disorder, single episode, unspecified: Secondary | ICD-10-CM | POA: Diagnosis not present

## 2017-11-15 DIAGNOSIS — Z79899 Other long term (current) drug therapy: Secondary | ICD-10-CM | POA: Diagnosis not present

## 2017-11-15 DIAGNOSIS — R590 Localized enlarged lymph nodes: Secondary | ICD-10-CM

## 2017-11-15 HISTORY — PX: MEDIASTINOSCOPY: SHX5086

## 2017-11-15 SURGERY — MEDIASTINOSCOPY
Anesthesia: General

## 2017-11-15 MED ORDER — ROCURONIUM BROMIDE 10 MG/ML (PF) SYRINGE
PREFILLED_SYRINGE | INTRAVENOUS | Status: AC
  Filled 2017-11-15: qty 5

## 2017-11-15 MED ORDER — SUGAMMADEX SODIUM 200 MG/2ML IV SOLN
INTRAVENOUS | Status: AC
  Filled 2017-11-15: qty 2

## 2017-11-15 MED ORDER — FENTANYL CITRATE (PF) 250 MCG/5ML IJ SOLN
INTRAMUSCULAR | Status: DC | PRN
  Administered 2017-11-15: 100 ug via INTRAVENOUS
  Administered 2017-11-15 (×2): 50 ug via INTRAVENOUS

## 2017-11-15 MED ORDER — ONDANSETRON HCL 4 MG/2ML IJ SOLN
INTRAMUSCULAR | Status: DC | PRN
  Administered 2017-11-15: 4 mg via INTRAVENOUS

## 2017-11-15 MED ORDER — PROMETHAZINE HCL 25 MG/ML IJ SOLN
6.2500 mg | INTRAMUSCULAR | Status: DC | PRN
Start: 2017-11-15 — End: 2017-11-15

## 2017-11-15 MED ORDER — EPHEDRINE SULFATE 50 MG/ML IJ SOLN
INTRAMUSCULAR | Status: DC | PRN
  Administered 2017-11-15: 10 mg via INTRAVENOUS
  Administered 2017-11-15 (×3): 5 mg via INTRAVENOUS

## 2017-11-15 MED ORDER — PROPOFOL 10 MG/ML IV BOLUS
INTRAVENOUS | Status: AC
Start: 2017-11-15 — End: ?
  Filled 2017-11-15: qty 20

## 2017-11-15 MED ORDER — PHENYLEPHRINE 40 MCG/ML (10ML) SYRINGE FOR IV PUSH (FOR BLOOD PRESSURE SUPPORT)
PREFILLED_SYRINGE | INTRAVENOUS | Status: AC
  Filled 2017-11-15: qty 10

## 2017-11-15 MED ORDER — EPHEDRINE SULFATE 50 MG/ML IJ SOLN
INTRAMUSCULAR | Status: AC
  Filled 2017-11-15: qty 1

## 2017-11-15 MED ORDER — PROPOFOL 10 MG/ML IV BOLUS
INTRAVENOUS | Status: DC | PRN
  Administered 2017-11-15: 150 mg via INTRAVENOUS

## 2017-11-15 MED ORDER — ACETAMINOPHEN 500 MG PO TABS: 1000.0000 mg | ORAL_TABLET | Freq: Four times a day (QID) | ORAL | 0 refills | Status: DC | PRN

## 2017-11-15 MED ORDER — MIDAZOLAM HCL 5 MG/5ML IJ SOLN
INTRAMUSCULAR | Status: DC | PRN
  Administered 2017-11-15: 2 mg via INTRAVENOUS

## 2017-11-15 MED ORDER — FENTANYL CITRATE (PF) 100 MCG/2ML IJ SOLN: 25.0000 ug | INTRAMUSCULAR | Status: DC | PRN

## 2017-11-15 MED ORDER — OXYCODONE HCL 5 MG PO TABS: 5.0000 mg | ORAL_TABLET | Freq: Four times a day (QID) | ORAL | Status: DC | PRN

## 2017-11-15 MED ORDER — ROCURONIUM BROMIDE 100 MG/10ML IV SOLN
INTRAVENOUS | Status: DC | PRN
  Administered 2017-11-15: 50 mg via INTRAVENOUS
  Administered 2017-11-15: 10 mg via INTRAVENOUS

## 2017-11-15 MED ORDER — ACETAMINOPHEN 500 MG PO TABS: 1000.0000 mg | ORAL_TABLET | Freq: Four times a day (QID) | ORAL | Status: DC | PRN

## 2017-11-15 MED ORDER — DEXAMETHASONE SODIUM PHOSPHATE 10 MG/ML IJ SOLN
INTRAMUSCULAR | Status: AC
  Filled 2017-11-15: qty 1

## 2017-11-15 MED ORDER — DEXAMETHASONE SODIUM PHOSPHATE 4 MG/ML IJ SOLN
INTRAMUSCULAR | Status: DC | PRN
  Administered 2017-11-15: 5 mg via INTRAVENOUS

## 2017-11-15 MED ORDER — CEFAZOLIN SODIUM-DEXTROSE 2-4 GM/100ML-% IV SOLN
2.0000 g | INTRAVENOUS | Status: AC
  Administered 2017-11-15: 2 g via INTRAVENOUS

## 2017-11-15 MED ORDER — LACTATED RINGERS IV SOLN
INTRAVENOUS | Status: DC | PRN
  Administered 2017-11-15: 07:00:00 via INTRAVENOUS

## 2017-11-15 MED ORDER — SUGAMMADEX SODIUM 200 MG/2ML IV SOLN
INTRAVENOUS | Status: DC | PRN
  Administered 2017-11-15: 186.8 mg via INTRAVENOUS

## 2017-11-15 MED ORDER — FENTANYL CITRATE (PF) 250 MCG/5ML IJ SOLN
INTRAMUSCULAR | Status: AC
  Filled 2017-11-15: qty 5

## 2017-11-15 MED ORDER — LIDOCAINE HCL (CARDIAC) PF 100 MG/5ML IV SOSY
PREFILLED_SYRINGE | INTRAVENOUS | Status: DC | PRN
  Administered 2017-11-15: 80 mg via INTRAVENOUS

## 2017-11-15 MED ORDER — MIDAZOLAM HCL 2 MG/2ML IJ SOLN
INTRAMUSCULAR | Status: AC
  Filled 2017-11-15: qty 2

## 2017-11-15 MED ORDER — OXYCODONE HCL 5 MG PO TABS: 5.0000 mg | ORAL_TABLET | Freq: Four times a day (QID) | ORAL | 0 refills | Status: DC | PRN

## 2017-11-15 MED ORDER — ONDANSETRON HCL 4 MG/2ML IJ SOLN
INTRAMUSCULAR | Status: AC
  Filled 2017-11-15: qty 2

## 2017-11-15 MED ORDER — 0.9 % SODIUM CHLORIDE (POUR BTL) OPTIME
TOPICAL | Status: DC | PRN
  Administered 2017-11-15: 1000 mL

## 2017-11-15 MED ORDER — CEFAZOLIN SODIUM-DEXTROSE 2-4 GM/100ML-% IV SOLN
INTRAVENOUS | Status: AC
  Filled 2017-11-15: qty 100

## 2017-11-15 MED ORDER — LIDOCAINE 2% (20 MG/ML) 5 ML SYRINGE
INTRAMUSCULAR | Status: AC
  Filled 2017-11-15: qty 5

## 2017-11-15 SURGICAL SUPPLY — 35 items
APPLIER CLIP LOGIC TI 5 (MISCELLANEOUS) IMPLANT
CANISTER SUCT 3000ML PPV (MISCELLANEOUS) ×2 IMPLANT
CLIP VESOCCLUDE MED 6/CT (CLIP) ×2 IMPLANT
CONT SPEC 4OZ CLIKSEAL STRL BL (MISCELLANEOUS) ×4 IMPLANT
COVER SURGICAL LIGHT HANDLE (MISCELLANEOUS) ×4 IMPLANT
DERMABOND ADVANCED (GAUZE/BANDAGES/DRESSINGS) ×1
DERMABOND ADVANCED .7 DNX12 (GAUZE/BANDAGES/DRESSINGS) ×1 IMPLANT
DRAPE CHEST BREAST 15X10 FENES (DRAPES) ×2 IMPLANT
ELECT REM PT RETURN 9FT ADLT (ELECTROSURGICAL) ×2
ELECTRODE REM PT RTRN 9FT ADLT (ELECTROSURGICAL) ×1 IMPLANT
GAUZE SPONGE 4X4 16PLY XRAY LF (GAUZE/BANDAGES/DRESSINGS) ×2 IMPLANT
GLOVE BIOGEL PI IND STRL 6.5 (GLOVE) ×1 IMPLANT
GLOVE BIOGEL PI INDICATOR 6.5 (GLOVE) ×1
GLOVE SURG SIGNA 7.5 PF LTX (GLOVE) ×4 IMPLANT
GOWN STRL REUS W/ TWL LRG LVL3 (GOWN DISPOSABLE) ×1 IMPLANT
GOWN STRL REUS W/ TWL XL LVL3 (GOWN DISPOSABLE) ×1 IMPLANT
GOWN STRL REUS W/TWL LRG LVL3 (GOWN DISPOSABLE) ×1
GOWN STRL REUS W/TWL XL LVL3 (GOWN DISPOSABLE) ×1
HEMOSTAT SURGICEL 2X14 (HEMOSTASIS) ×2 IMPLANT
KIT BASIN OR (CUSTOM PROCEDURE TRAY) ×2 IMPLANT
KIT TURNOVER KIT B (KITS) ×2 IMPLANT
NS IRRIG 1000ML POUR BTL (IV SOLUTION) ×2 IMPLANT
PACK GENERAL/GYN (CUSTOM PROCEDURE TRAY) ×2 IMPLANT
PAD ARMBOARD 7.5X6 YLW CONV (MISCELLANEOUS) ×4 IMPLANT
SPONGE INTESTINAL PEANUT (DISPOSABLE) IMPLANT
SUT SILK 2 0 (SUTURE)
SUT SILK 2-0 18XBRD TIE 12 (SUTURE) IMPLANT
SUT VIC AB 2-0 CT1 27 (SUTURE)
SUT VIC AB 2-0 CT1 TAPERPNT 27 (SUTURE) IMPLANT
SUT VIC AB 3-0 SH 18 (SUTURE) ×2 IMPLANT
SUT VICRYL 4-0 PS2 18IN ABS (SUTURE) ×2 IMPLANT
SYR 10ML LL (SYRINGE) ×2 IMPLANT
TOWEL GREEN STERILE (TOWEL DISPOSABLE) ×2 IMPLANT
TOWEL GREEN STERILE FF (TOWEL DISPOSABLE) ×2 IMPLANT
WATER STERILE IRR 1000ML POUR (IV SOLUTION) ×2 IMPLANT

## 2017-11-15 NOTE — Discharge Instructions (Signed)
Do not drive or engage in heavy physical activity for 48 hours  After that you may drive when you are no longer taking oxycodone for pain  You may shower tomorrow  There is a medical adhesive over the incision. It will begin to peel off in 10-14 days  You have a prescription for oxycodone, a narcotic pain reliever. You may use as directed. You may use acetaminophen (Tylenol) in addition to, or instead of the oxycodone.  My office will contact you with a follow up appointment.  Call [phone] if you develop chest pain, shortness of breath, fever > 101 F or notice excessive pain, swelling, redness of drainage from the incision

## 2017-11-15 NOTE — Interval H&P Note (Signed)
History and Physical Interval Note:  11/15/2017 7:23 AM  Troy [name]  has presented today for surgery, with the diagnosis of mediastinal adenopathy  The various methods of treatment have been discussed with the patient and family. After consideration of risks, benefits and other options for treatment, the patient has consented to  Procedure(s): MEDIASTINOSCOPY (N/A) as a surgical intervention .  The patient's history has been reviewed, patient examined, no change in status, stable for surgery.  I have reviewed the patient's chart and labs.  Questions were answered to the patient's satisfaction.     Loreli Slot

## 2017-11-15 NOTE — Anesthesia Procedure Notes (Signed)
Procedure Name: Intubation Date/Time: 11/15/2017 7:37 AM Performed by: Glynda Jaeger, CRNA Pre-anesthesia Checklist: Patient identified, Patient being monitored, Timeout performed, Emergency Drugs available and Suction available Patient Re-evaluated:Patient Re-evaluated prior to induction Oxygen Delivery Method: Circle System Utilized Preoxygenation: Pre-oxygenation with 100% oxygen Induction Type: IV induction Ventilation: Mask ventilation without difficulty Laryngoscope Size: Mac and 4 Grade View: Grade II Tube type: Oral Tube size: 7.5 mm Number of attempts: 1 Airway Equipment and Method: Stylet Placement Confirmation: ETT inserted through vocal cords under direct vision,  positive ETCO2 and breath sounds checked- equal and bilateral Secured at: 22 cm Tube secured with: Tape Dental Injury: Teeth and Oropharynx as per pre-operative assessment

## 2017-11-15 NOTE — Anesthesia Postprocedure Evaluation (Signed)
Anesthesia Post Note  Patient: Janice Coffin  Procedure(s) Performed: MEDIASTINOSCOPY (N/A )     Patient location during evaluation: PACU Anesthesia Type: General Level of consciousness: awake and alert Pain management: pain level controlled Vital Signs Assessment: post-procedure vital signs reviewed and stable Respiratory status: spontaneous breathing, nonlabored ventilation, respiratory function stable and patient connected to nasal cannula oxygen Cardiovascular status: blood pressure returned to baseline and stable Postop Assessment: no apparent nausea or vomiting Anesthetic complications: no    Last Vitals:  Vitals:   11/15/17 0931 11/15/17 0945  BP: 133/80   Pulse: 70   Resp: 17   Temp:  36.5 C  SpO2: 96%     Last Pain:  Vitals:   11/15/17 0945  TempSrc:   PainSc: 0-No pain                 Kennieth Rad

## 2017-11-15 NOTE — Brief Op Note (Signed)
11/15/2017  9:44 AM  PATIENT:  Troy [name]  62 y.o. male  PRE-OPERATIVE DIAGNOSIS:  mediastinal adenopathy  POST-OPERATIVE DIAGNOSIS:  mediastinal adenopathy- Granulomatous disease  PROCEDURE:  Procedure(s): MEDIASTINOSCOPY (N/A)  SURGEON:  Surgeon(s) and Role:    * Loreli Slot, MD - Primary  PHYSICIAN ASSISTANT:   ASSISTANTS: none   ANESTHESIA:   general  EBL:  15 mL   BLOOD ADMINISTERED:none  DRAINS: none   LOCAL MEDICATIONS USED:  NONE  SPECIMEN:  Source of Specimen:  mediastinal lymph nodes  DISPOSITION OF SPECIMEN:  Path and micro  COUNTS:  YES  TOURNIQUET:  * No tourniquets in log *  DICTATION: .Other Dictation: Dictation Number -  PLAN OF CARE: Discharge to home after PACU  PATIENT DISPOSITION:  PACU - hemodynamically stable.   Delay start of Pharmacological VTE agent (>24hrs) due to surgical blood loss or risk of bleeding: not applicable

## 2017-11-15 NOTE — Op Note (Signed)
NAMEWINTHROP, SHANNAHAN MEDICAL RECORD ZO:[id number] ACCOUNT [id number] DATE OF BIRTH:[birth year] FACILITY: [facility] LOCATION: [facility] PHYSICIAN:Sanari Offner Lars Pinks, MD  OPERATIVE REPORT  DATE OF PROCEDURE:  11/15/2017  PREOPERATIVE DIAGNOSIS:  Mediastinal adenopathy.  POSTOPERATIVE DIAGNOSIS:  Granulomatous disease.  PROCEDURE:  Mediastinoscopy.  SURGEON:  Charlett Lango, MD  ASSISTANT:  None.  ANESTHESIA:  General.  FINDINGS:  Enlarged right paratracheal lymph node.  Granulomas seen on frozen section.  CLINICAL NOTE:  The patient is a 62 year old gentleman who was sent for consultation regarding mediastinal adenopathy.  He was advised to undergo mediastinoscopy as the primary differential was granulomatous disease versus lymphoma.  The indications,  risks, benefits, and alternatives were discussed in detail with the patient.  He understood and accepted the risks and agreed to proceed.  OPERATIVE NOTE:  The patient was brought to the operating room on 11/15/2017.  He had induction of general anesthesia and was intubated.  The neck and chest were prepped and draped in the usual sterile fashion.  After performing a timeout, a transverse incision was made 1 fingerbreadth above the sternal notch.  It was carried through the skin and subcutaneous tissue.  Hemostasis was achieved with electrocautery.  The strap muscles were separated in the midline.   The pretracheal fascia was identified and incised, and the pretracheal plane was developed bluntly into the mediastinum.  The mediastinoscope was inserted, and systematic inspection of the mediastinal lymph node stations was carried out.  There was a  large right paratracheal lymph node in the 4R location.  Biopsies were taken from this lesion and sent for frozen section.  Additional biopsies were sent for AFB and fungal cultures, and finally, multiple additional biopsies were taken and sent for  permanent pathology.  The wound was packed  with gauze.  Frozen section returned showing granulomatous disease.  The gauze packing was removed.  The mediastinoscope was reinserted.  There was no ongoing bleeding.  The mediastinoscope was withdrawn.  The  incision was closed in 2 layers.  Dermabond was applied to the incision.  The patient was extubated in the operating room and taken to the Postanesthetic Care Unit in good condition.  LN/NUANCE  D:11/15/2017 T:11/15/2017 JOB:[id number]

## 2017-11-15 NOTE — Transfer of Care (Signed)
Immediate Anesthesia Transfer of Care Note  Patient: Troy [name]  Procedure(s) Performed: MEDIASTINOSCOPY (N/A )  Patient Location: PACU  Anesthesia Type:General  Level of Consciousness: awake, alert , oriented, patient cooperative and responds to stimulation  Airway & Oxygen Therapy: Patient Spontanous Breathing and Patient connected to face mask oxygen  Post-op Assessment: Report given to RN, Post -op Vital signs reviewed and stable and Patient moving all extremities X 4  Post vital signs: Reviewed and stable  Last Vitals:  Vitals Value Taken Time  BP 135/78 11/15/2017  9:16 AM  Temp    Pulse 73 11/15/2017  9:18 AM  Resp 20 11/15/2017  9:18 AM  SpO2 97 % 11/15/2017  9:18 AM  Vitals shown include unvalidated device data.  Last Pain:  Vitals:   11/15/17 0626  TempSrc:   PainSc: 0-No pain      Patients Stated Pain Goal: 5 (11/15/17 1610)  Complications: No apparent anesthesia complications

## 2017-11-16 ENCOUNTER — Encounter (HOSPITAL_COMMUNITY): Payer: Self-pay | Admitting: Thoracic Surgery (Cardiothoracic Vascular Surgery)

## 2017-11-16 LAB — ACID FAST SMEAR (AFB, MYCOBACTERIA): Acid Fast Smear: NEGATIVE

## 2017-11-18 ENCOUNTER — Encounter: Payer: Self-pay | Admitting: Hematology & Oncology

## 2017-11-23 ENCOUNTER — Ambulatory Visit (INDEPENDENT_AMBULATORY_CARE_PROVIDER_SITE_OTHER): Payer: BC Managed Care – PPO | Admitting: Thoracic Surgery (Cardiothoracic Vascular Surgery)

## 2017-11-23 VITALS — BP 120/66 | HR 72 | Resp 20 | Ht 72.0 in | Wt 205.0 lb

## 2017-11-23 DIAGNOSIS — R590 Localized enlarged lymph nodes: Secondary | ICD-10-CM

## 2017-11-23 NOTE — Progress Notes (Signed)
[address]       [name] [address]             [phone]     HPI: Dr. Cori Razor returns for scheduled follow-up visit  Troy [name] is a 62 year old gentleman with a past medical history of stroke secondary to PFO, hypertension, hyperlipidemia, depression, migraines.  He has a known ascending aortic aneurysm.  His aneurysm was first noted in October CT of the chest.  It was 4.3 cm at that time.  He had a follow-up scan in April.  The aneurysm was reported to be 4.8 cm, but side-by-side review reveals no change with the true diameter being 4.3 to 4.4 cm.  He was however noted to have mediastinal adenopathy.  I did a mediastinoscopy on 11/15/2017.  He did well with the procedure.  He denies any pain or swelling at the incision.  Past Medical History:  Diagnosis Date  . Cluster headaches    "q couple years" (07/15/2015)  . Depression    "mood stabilizer"  . Fibromyalgia   . Hypercholesterolemia   . Hypertension   . PFO with atrial septal aneurysm 07/15/2015   s/p 35 mm Amplatzer PFO device by Dr. Excell Seltzer 07/15/2015  . Stroke [facility]) 11/2014; 05/2015   denies residual from either on 07/15/2015    Current Outpatient Medications  Medication Sig Dispense Refill  . acetaminophen (TYLENOL) 500 MG tablet Take 2 tablets (1,000 mg total) by mouth every 6 (six) hours as needed for mild pain or moderate pain. 30 tablet 0  . amLODipine (NORVASC) 2.5 MG tablet Take 2.5 mg by mouth daily.    Marland Kitchen aspirin EC 81 MG tablet Take 1 tablet (81 mg total) by mouth daily. 30 tablet 0  . atorvastatin (LIPITOR) 20 MG tablet Take 20 mg by mouth daily.   7  . Difluprednate (DUREZOL) 0.05 % EMUL Place 1 drop into the right eye 2 (two) times daily.    Marland Kitchen ofloxacin (OCUFLOX) 0.3 % ophthalmic solution Place 1 drop into the right eye 2 (two) times daily.    Marland Kitchen oxyCODONE (OXY IR/ROXICODONE) 5 MG immediate release tablet Take 1-2 tablets (5-10 mg total) by mouth every 6 (six) hours as needed for  moderate pain or severe pain. 30 tablet 0  . PARoxetine (PAXIL) 10 MG tablet Take 10 mg by mouth daily.   0  . PRESCRIPTION MEDICATION Place 1 spray into the nose as directed. Lidocaine nasal spray 4% compounded. I spray in affected nostril every 1 hr as directed    . SUMAtriptan (IMITREX) 50 MG tablet Take 1 tablet by mouth every 2 (two) hours as needed for migraine or headache. May repeat once in 2 hours. Do not exceed 200 mg per day.    . triamterene-hydrochlorothiazide (MAXZIDE-25) 37.5-25 MG tablet Take 1 tablet by mouth daily.  0   No current facility-administered medications for this visit.     Physical Exam BP 120/66   Pulse 72   Resp 20   Ht 6' (1.829 m)   Wt 205 lb (93 kg)   BMI 27.88 kg/m  62 year old man in no acute distress Alert and oriented x 3 no deficits Incision healing well  Diagnostic Tests: Diagnosis 1. Lymph node, biopsy, 4 R - NON-CASEATING GRANULOMATOUS INFLAMMATION - SEE COMMENT 2. Lymph node, biopsy, 4 R#2 - NON-CASEATING GRANULOMATOUS INFLAMMATION - SEE COMMENT 3. Lymph node, biopsy, 4 R #3 - NON-CASEATING GRANULOMATOUS INFLAMMATION - SEE COMMENT Microscopic Comment 1. -3. The  lymph node architecture is effaced by a non-caseating granulomatous inflammation. Eosinophils are not identified within the tissue sections. PAS, AFB and GMS stains are negative for microorganisms. The overall morphology is consistent with sarcoidosis in the absence of infection; correlation with microbiology cultures is recommended. 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  He had a full recovery from that.  He said the PFO closed percutaneously.  During that work-up he was noted to have a 4.3 cm ascending aortic aneurysm.  Subsequent CT showed mediastinal adenopathy.  Ascending aortic aneurysm-4.4 cm on most recent  CT.  Needs follow-up.  I recommended another follow-up scan in 6 months is to be on the safe side.  We will do that with contrast to get a better measurement of the aorta.  Mediastinal adenopathy-biopsy showed noncaseating granulomas consistent with sarcoidosis.  No evidence of malignancy.  AFB and fungal stains were negative.  Final culture results are pending to take several weeks to come back.  He is asymptomatic but I am going to refer him to pulmonology for assessment.  His activities are unrestricted.  Plan: Return in 6 months with CT angiogram of chest  Loreli Slot, MD [facility] [phone]

## 2017-11-26 ENCOUNTER — Ambulatory Visit: Payer: BC Managed Care – PPO | Admitting: Hematology & Oncology

## 2017-11-26 ENCOUNTER — Other Ambulatory Visit: Payer: BC Managed Care – PPO

## 2017-12-06 LAB — CULTURE, FUNGUS WITHOUT SMEAR

## 2017-12-28 LAB — ACID FAST CULTURE WITH REFLEXED SENSITIVITIES (MYCOBACTERIA): Acid Fast Culture: NEGATIVE

## 2018-01-20 ENCOUNTER — Institutional Professional Consult (permissible substitution): Payer: BC Managed Care – PPO | Admitting: Pulmonary Disease

## 2018-02-22 ENCOUNTER — Ambulatory Visit: Payer: BC Managed Care – PPO | Admitting: Pulmonary Disease

## 2018-02-22 ENCOUNTER — Encounter: Payer: Self-pay | Admitting: Pulmonary Disease

## 2018-02-22 VITALS — BP 140/100 | HR 72 | Ht 70.87 in | Wt 204.0 lb

## 2018-02-22 DIAGNOSIS — Q211 Atrial septal defect: Secondary | ICD-10-CM

## 2018-02-22 DIAGNOSIS — D869 Sarcoidosis, unspecified: Secondary | ICD-10-CM

## 2018-02-22 DIAGNOSIS — R590 Localized enlarged lymph nodes: Secondary | ICD-10-CM | POA: Diagnosis not present

## 2018-02-22 DIAGNOSIS — I253 Aneurysm of heart: Secondary | ICD-10-CM

## 2018-02-22 NOTE — Progress Notes (Signed)
Synopsis: Referred in Aug 2019 for sarcoidosis He had a mediastinal lymph node biopsy in May 2019 which showed noncaseating granulomas.  Subjective:   PATIENT ID: Janice Coffin GENDER: male DOB: 09-27-1955, MRN: [id number]   HPI  Chief Complaint  Patient presents with  . New Consult    Sarcoidosis     This is a pleasant 61 year old male who comes to my clinic today to establish care for granulomatous lung disease.  He says that he had a normal childhood without respiratory illnesses and has never suffered from any significant respiratory complaints other than the occasional cold.  Specifically, he is experiencing a cold now which developed about 3 days ago.  However, as part of a work-up for a stroke he was found to have a PFO.  During this work-up he was noted to have lung nodules and then eventually mediastinal lymphadenopathy.  This led to a mediastinoscopy earlier this year performed by Dr. Dorris Fetch.  The pathology showed evidence of noncaseating granulomas, no eosinophilia, and special stains for organisms were all negative.  He tells me that he exercises on a daily basis without feeling short of breath.  He does not have problems with cough other than when he has a cold.  He says that he does some routine work around the house and he does repairs on his motorcycle.  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  He does not work in a hood and he does not work with chemicals.  He has a dog at home.  Past Medical History:  Diagnosis Date  . Cluster headaches    "q couple years" (07/15/2015)  . Depression    "mood stabilizer"  . Fibromyalgia   . Hypercholesterolemia   . Hypertension   . PFO with atrial septal aneurysm 07/15/2015   s/p 35 mm  Amplatzer PFO device by Dr. Excell Seltzer 07/15/2015  . Stroke [facility]) 11/2014; 05/2015   denies residual from either on 07/15/2015     Family History  Problem Relation Age of Onset  . Headache Father      Social History   Socioeconomic History  . Marital status: Married    Spouse name: Alona Bene  . Number of children: 1  . Years of education: PhD  . Highest education level: Not on file  Occupational History  . Occupation: [employer]  Social Needs  . Financial resource strain: Not on file  . Food insecurity:    Worry: Not on file    Inability: Not on file  . Transportation needs:    Medical: Not on file    Non-medical: Not on file  Tobacco Use  . Smoking status: Never Smoker  . Smokeless tobacco: Never Used  Substance and Sexual Activity  . Alcohol use: Yes    Alcohol/week: 5.0 standard drinks    Types: 5 Cans of beer per week  . Drug use: No  . Sexual activity: Not Currently  Lifestyle  . Physical activity:    Days per week: Not on file    Minutes per session: Not on file  . Stress: Not on file  Relationships  . Social connections:    Talks on phone: Not on file    Gets together: Not on file    Attends  religious service: Not on file    Active member of club or organization: Not on file    Attends meetings of clubs or organizations: Not on file    Relationship status: Not on file  . Intimate partner violence:    Fear of current or ex partner: Not on file    Emotionally abused: Not on file    Physically abused: Not on file    Forced sexual activity: Not on file  Other Topics Concern  . Not on file  Social History Narrative   Lives with wife [name]   Moved from [place]. Moved about 5 years ago   Caffeine use: daily     No Known Allergies   Outpatient Medications Prior to Visit  Medication Sig Dispense Refill  . acetaminophen (TYLENOL) 500 MG tablet Take 2 tablets (1,000 mg total) by mouth every 6 (six) hours as needed for mild pain or moderate pain. 30 tablet 0  .  amLODipine (NORVASC) 2.5 MG tablet Take 2.5 mg by mouth daily.    Marland Kitchen. aspirin EC 81 MG tablet Take 1 tablet (81 mg total) by mouth daily. 30 tablet 0  . atorvastatin (LIPITOR) 20 MG tablet Take 20 mg by mouth daily.   7  . PARoxetine (PAXIL) 10 MG tablet Take 10 mg by mouth daily.   0  . triamterene-hydrochlorothiazide (MAXZIDE-25) 37.5-25 MG tablet Take 1 tablet by mouth daily.  0  . Difluprednate (DUREZOL) 0.05 % EMUL Place 1 drop into the right eye 2 (two) times daily.    Marland Kitchen. ofloxacin (OCUFLOX) 0.3 % ophthalmic solution Place 1 drop into the right eye 2 (two) times daily.    Marland Kitchen. oxyCODONE (OXY IR/ROXICODONE) 5 MG immediate release tablet Take 1-2 tablets (5-10 mg total) by mouth every 6 (six) hours as needed for moderate pain or severe pain. (Patient not taking: Reported on 02/22/2018) 30 tablet 0  . PRESCRIPTION MEDICATION Place 1 spray into the nose as directed. Lidocaine nasal spray 4% compounded. I spray in affected nostril every 1 hr as directed    . SUMAtriptan (IMITREX) 50 MG tablet Take 1 tablet by mouth every 2 (two) hours as needed for migraine or headache. May repeat once in 2 hours. Do not exceed 200 mg per day.     No facility-administered medications prior to visit.     Review of Systems  Constitutional: Negative for fever and weight loss.  HENT: Positive for congestion and nosebleeds. Negative for ear pain and sore throat.   Eyes: Negative for redness.  Respiratory: Positive for cough and wheezing. Negative for shortness of breath.   Cardiovascular: Negative for palpitations, leg swelling and PND.  Gastrointestinal: Negative for nausea and vomiting.  Genitourinary: Negative for dysuria.  Skin: Negative for rash.  Neurological: Negative for headaches.  Endo/Heme/Allergies: Does not bruise/bleed easily.  Psychiatric/Behavioral: Negative for depression. The patient is not nervous/anxious.       Objective:  Physical Exam   Vitals:   02/22/18 0900  BP: (!) 140/100  Pulse:  72  SpO2: 99%  Weight: 92.5 kg  Height: 5' 10.87" (1.8 m)    Gen: well appearing, no acute distress HENT: NCAT, OP clear, neck supple without masses Eyes: PERRL, EOMi Lymph: no cervical lymphadenopathy PULM: CTA B CV: RRR, no mgr, no JVD GI: BS+, soft, nontender, no hsm Derm: no rash or skin breakdown MSK: normal bulk and tone Neuro: A&Ox4, CN II-XII intact, strength 5/5 in all 4 extremities Psyche: normal mood and affect   CBC  Component Value Date/Time   WBC 4.7 11/12/2017 0903   RBC 5.10 11/12/2017 0903   HGB 14.0 11/12/2017 0903   HGB 14.3 10/20/2017 1344   HCT 42.1 11/12/2017 0903   PLT 199 11/12/2017 0903   PLT 172 10/20/2017 1344   MCV 82.5 11/12/2017 0903   MCH 27.5 11/12/2017 0903   MCHC 33.3 11/12/2017 0903   RDW 12.7 11/12/2017 0903   LYMPHSABS 1.1 10/20/2017 1344   MONOABS 0.5 10/20/2017 1344   EOSABS 0.1 10/20/2017 1344   BASOSABS 0.1 10/20/2017 1344     Chest imaging: April 2019 CT chest images independently reviewed showing normal pulmonary parenchyma with the exception of a few nodules, 4 mm in size and mucus plugging in the left lower lobe, there is mediastinal adenopathy noted.  PFT:  Labs:  Path: May 2019 multiple lymph nodes from station 4R showed noncaseating granulomas, special stains were negative for organisms.  Micro: Nov 15, 2017 lymphoid tissue culture showed negative AFB and fungus culture   Echo:  Heart Catheterization:   Records from his mediastinoscopy in May 2019 reviewed where a 4R lymph node was resected.    Assessment & Plan:   Sarcoidosis - Plan: Pulmonary function test  Mediastinal adenopathy  PFO with atrial septal aneurysm  Discussion: This is a pleasant 62 year old male who comes to my clinic today to establish care for sarcoidosis.  He was found to have noncaseating granulomas on a mediastinal lymph node biopsy without culture or histologic evidence of an underlying infection.  There is also no evidence  of eosinophilia.  So at this time, he has a syndrome consistent with sarcoidosis.  Fortunately only seems to be involved in his mediastinal lymph nodes.  I explained to him the patient suggest to have mediastinal lymph node involvement have a very low likelihood of disease progression (less than 10%).  I think it is a good idea to get lung function testing now and again in a year.  I also think he needs to have his ophthalmologist aware of his disease.  Plan: Sarcoidosis: We will check a lung function test Let your ophthalmologist know that she have this diagnosis If you have symptoms of shortness of breath, unexplained cough or chest tightness come back and see [place]  We will plan on seeing you back in 1 year, at that time we will repeat a lung function test for comparison to this year's study.    Current Outpatient Medications:  .  acetaminophen (TYLENOL) 500 MG tablet, Take 2 tablets (1,000 mg total) by mouth every 6 (six) hours as needed for mild pain or moderate pain., Disp: 30 tablet, Rfl: 0 .  amLODipine (NORVASC) 2.5 MG tablet, Take 2.5 mg by mouth daily., Disp: , Rfl:  .  aspirin EC 81 MG tablet, Take 1 tablet (81 mg total) by mouth daily., Disp: 30 tablet, Rfl: 0 .  atorvastatin (LIPITOR) 20 MG tablet, Take 20 mg by mouth daily. , Disp: , Rfl: 7 .  PARoxetine (PAXIL) 10 MG tablet, Take 10 mg by mouth daily. , Disp: , Rfl: 0 .  triamterene-hydrochlorothiazide (MAXZIDE-25) 37.5-25 MG tablet, Take 1 tablet by mouth daily., Disp: , Rfl: 0

## 2018-02-22 NOTE — Patient Instructions (Signed)
Sarcoidosis: We will check a lung function test Let your ophthalmologist know that she have this diagnosis If you have symptoms of shortness of breath, unexplained cough or chest tightness come back and see us  We will plan on seeing you back in 1 year, at that time we will repeat a lung function test for comparison to this year's study.

## 2018-03-02 ENCOUNTER — Ambulatory Visit (INDEPENDENT_AMBULATORY_CARE_PROVIDER_SITE_OTHER): Payer: BC Managed Care – PPO | Admitting: Pulmonary Disease

## 2018-03-02 DIAGNOSIS — D869 Sarcoidosis, unspecified: Secondary | ICD-10-CM

## 2018-03-02 LAB — PULMONARY FUNCTION TEST
DL/VA % pred: 111 %
DL/VA: 5.12 ml/min/mmHg/L
DLCO unc % pred: 107 %
DLCO unc: 34.72 ml/min/mmHg
FEF 25-75 Post: 3.28 L/sec
FEF 25-75 Pre: 2.2 L/sec
FEF2575-%Change-Post: 48 %
FEF2575-%Pred-Post: 118 %
FEF2575-%Pred-Pre: 79 %
FEV1-%Change-Post: 11 %
FEV1-%Pred-Post: 93 %
FEV1-%Pred-Pre: 83 %
FEV1-Post: 3.26 L
FEV1-Pre: 2.91 L
FEV1FVC-%Change-Post: 3 %
FEV1FVC-%Pred-Pre: 99 %
FEV6-%Change-Post: 7 %
FEV6-%Pred-Post: 94 %
FEV6-%Pred-Pre: 87 %
FEV6-Post: 4.15 L
FEV6-Pre: 3.86 L
FEV6FVC-%Change-Post: 0 %
FEV6FVC-%Pred-Post: 104 %
FEV6FVC-%Pred-Pre: 104 %
FVC-%Change-Post: 7 %
FVC-%Pred-Post: 90 %
FVC-%Pred-Pre: 84 %
FVC-Post: 4.2 L
FVC-Pre: 3.9 L
Post FEV1/FVC ratio: 78 %
Post FEV6/FVC ratio: 99 %
Pre FEV1/FVC ratio: 75 %
Pre FEV6/FVC Ratio: 99 %
RV % pred: 110 %
RV: 2.55 L
TLC % pred: 97 %
TLC: 6.87 L

## 2018-03-02 NOTE — Progress Notes (Signed)
PFT done today. 

## 2018-03-09 NOTE — Telephone Encounter (Signed)
McQuaid,  Please advise on  PFT results and then we will mail copy to patient. Thanks.

## 2018-05-09 ENCOUNTER — Telehealth: Payer: Self-pay | Admitting: *Deleted

## 2018-05-09 ENCOUNTER — Other Ambulatory Visit: Payer: Self-pay | Admitting: *Deleted

## 2018-05-09 DIAGNOSIS — I712 Thoracic aortic aneurysm, without rupture, unspecified: Secondary | ICD-10-CM

## 2018-05-09 NOTE — Telephone Encounter (Signed)
Received refill request from [facility] for Medrol dosepack. Pt has not been seen since 04/26/17. Dr. Lucia Gaskins aware. Refill refused. Pharmacy notified via fax and included note that patient needs f/u appt.

## 2018-05-31 ENCOUNTER — Other Ambulatory Visit: Payer: Self-pay

## 2018-05-31 ENCOUNTER — Encounter: Payer: Self-pay | Admitting: Thoracic Surgery (Cardiothoracic Vascular Surgery)

## 2018-05-31 ENCOUNTER — Ambulatory Visit
Admission: RE | Admit: 2018-05-31 | Discharge: 2018-05-31 | Disposition: A | Discharge status: Home / Self Care | Payer: BC Managed Care – PPO | Source: Ambulatory Visit | Attending: Thoracic Surgery (Cardiothoracic Vascular Surgery) | Admitting: Thoracic Surgery (Cardiothoracic Vascular Surgery)

## 2018-05-31 ENCOUNTER — Ambulatory Visit: Payer: BC Managed Care – PPO | Admitting: Thoracic Surgery (Cardiothoracic Vascular Surgery)

## 2018-05-31 VITALS — BP 122/90 | HR 73 | Resp 18 | Ht 70.87 in | Wt 211.0 lb

## 2018-05-31 DIAGNOSIS — I712 Thoracic aortic aneurysm, without rupture, unspecified: Secondary | ICD-10-CM

## 2018-05-31 DIAGNOSIS — D869 Sarcoidosis, unspecified: Secondary | ICD-10-CM | POA: Diagnosis not present

## 2018-05-31 DIAGNOSIS — I7121 Aneurysm of the ascending aorta, without rupture: Secondary | ICD-10-CM

## 2018-05-31 MED ORDER — IOPAMIDOL (ISOVUE-370) INJECTION 76%
75.0000 mL | Freq: Once | INTRAVENOUS | Status: AC | PRN
  Administered 2018-05-31: 75 mL via INTRAVENOUS

## 2018-05-31 NOTE — Progress Notes (Signed)
[address]       [place] [address]             [phone]        [address] [place]       [name] [address]             [phone]     HPI: Mr. Cordrey returns for scheduled follow-up visit  Brolin Dambrosia is a 62 year old man with a history of a stroke secondary to a PFO, hypertension, hyperlipidemia, depression, migraines, ascending aneurysm, and sarcoidosis.  He was first found to have an ascending aneurysm in October 2018.  It was 4.3 cm at that time.  A follow-up scan in April was unchanged despite the official reading saying 4.8 cm.  He was noted to have mediastinal and hilar adenopathy.  I did a mediastinoscopy on 11/15/2017.  That turned out to be secondary to sarcoidosis.  He saw Dr. Kendrick Fries.  He is not symptomatic so is just being followed.  In the interim since his last visit he has been feeling well.  He does not have any fevers, chills or sweats.  He denies chest pain, pressure, tightness, or shortness of breath.  He remains active.  Past Medical History:  Diagnosis Date  . Cluster headaches    "q couple years" (07/15/2015)  . Depression    "mood stabilizer"  . Fibromyalgia   . Hypercholesterolemia   . Hypertension   . PFO with atrial septal aneurysm 07/15/2015   s/p 35 mm Amplatzer PFO device by Dr. Excell Seltzer 07/15/2015  . Stroke [facility]) 11/2014; 05/2015   denies residual from either on 07/15/2015    Current Outpatient Medications  Medication Sig Dispense Refill  . amLODipine (NORVASC) 2.5 MG tablet Take 2.5 mg by mouth daily.    Marland Kitchen aspirin EC 81 MG tablet Take 1 tablet (81 mg total) by mouth daily. 30 tablet 0  . atorvastatin (LIPITOR) 20 MG tablet Take 20 mg by mouth daily.   7  . PARoxetine (PAXIL) 10 MG tablet Take 10 mg by mouth daily.   0  . SUMAtriptan (IMITREX) 50 MG tablet Take 50 mg by mouth daily as needed.     . triamterene-hydrochlorothiazide (MAXZIDE-25) 37.5-25 MG tablet Take 1 tablet by mouth daily.  0   No  current facility-administered medications for this visit.     Physical Exam BP 122/90 (BP Location: Right Arm, Patient Position: Sitting, Cuff Size: Normal)   Pulse 73   Resp 18   Ht 5' 10.87" (1.8 m)   Wt 211 lb (95.7 kg)   SpO2 95% Comment: RA  BMI 29.48 kg/m  62 year old man in no acute distress Alert and oriented x3 with no focal deficits Lungs clear with equal breath sounds bilaterally No cervical or supra clavicular adenopathy Cardiac regular rate and rhythm normal S1 and S2, no murmur  Diagnostic Tests: CT ANGIOGRAPHY CHEST WITH CONTRAST  TECHNIQUE: Multidetector CT imaging of the chest was performed using the standard protocol during bolus administration of intravenous contrast. Multiplanar CT image reconstructions and MIPs were obtained to evaluate the vascular anatomy.  CONTRAST:  75mL ISOVUE-370 IOPAMIDOL (ISOVUE-370) INJECTION 76%  Creatinine was obtained on site at [facility] at [address]  Results: Creatinine 0.8 mg/dL.  COMPARISON:  04/13/2017, 10/15/2017  FINDINGS: Cardiovascular: Dilatation of the ascending aorta is again identified measuring 4.2 x 4.3 cm at the level of the pulmonary artery. The aorta measures 3.8 cm at the  level of the sinus of Valsalva. No dissection is identified. No significant coronary calcifications are seen. Heart is not significantly enlarged. Closure device in the foramen ovale is noted. Pulmonary artery as visualized shows no central pulmonary embolism.  Mediastinum/Nodes: Thoracic inlet is within normal limits. The previously seen bulky adenopathy noted in April of 2019 has reduced in both size and number. The largest of these residual nodes lies in the right paratracheal region best seen on image number 56 of series 5. It is stable in appearance measuring 2.2 cm in greatest dimension. These nodes have been previously biopsied and shown to represent granulomatous disease. Small hilar nodes are  noted also decreased in size particularly on the right. The esophagus is within normal limits. Postsurgical changes are noted in the superior mediastinum consistent with the prior biopsy.  Lungs/Pleura: Lungs are well aerated bilaterally. Previously seen small nodules within the lingula are again identified and stable. There are however a few new perivascular nodules identified particularly in the right upper lobe. A 5 mm nodule is noted on image number 45 of series 7 and a 7 mm nodule on image number 50 of series 7. 6 mm nodule is noted in the inferior right upper lobe best seen on image number 70 of series 7. Stable subpleural right middle lobe nodule is seen. No new right lower lobe nodules are noted. A few new lingular nodules are seen measuring less than 5 mm on image number 85 of series 7 as well as image number 102 of series 7. These may be better visualized due to the thinner slice thickness.  Upper Abdomen: Visualized upper abdomen is unremarkable.  Musculoskeletal: Postsurgical changes are noted in the right shoulder. Degenerative changes of the thoracic spine are seen. No acute bony abnormality is noted.  Review of the MIP images confirms the above findings.  IMPRESSION: Stable ascending thoracic aortic aneurysm. Recommend annual imaging followup by CTA or MRA. This recommendation follows 2010 ACCF/AHA/AATS/ACR/ASA/SCA/SCAI/SIR/STS/SVM Guidelines for the Diagnosis and Management of Patients with Thoracic Aortic Disease. Circulation. 2010; 121: [id number]  Improvement in bulky mediastinal and hilar adenopathy when compared with the most recent exam from 10/15/2017. These have been previously biopsied and shown to represent granulomatous disease.  Multiple noncalcified nodules throughout both lungs. Majority of these are stable although some new small nodules are identified bilaterally as described. These are likely related to the underlying granulomatous disease  in can be followed with the aneurysm follow-up protocol.  Aortic aneurysm NOS (ICD10-I71.9).   Electronically Signed   By: Alcide CleverMark  Lukens M.D.   On: 05/31/2018 09:33 I personally reviewed the CT images and concur with the findings noted above.  Impression: [name] is a 62 year old gentleman with a history of a stroke secondary to a PFO, PFO closure with Amplatz device, hypertension, hyperlipidemia, sarcoidosis, and an ascending aneurysm.  He returns for a scheduled follow-up visit.  Ascending aneurysm-unchanged at 4.3 cm.  Needs continued annual follow-up.  Hypertension-blood pressure well controlled today on current regimen  Sarcoidosis-adenopathy and small lung nodules unchanged to slightly improved in the interval since last scan.  Followed by Dr. Kendrick FriesMcQuaid  Plan: Return in 1 year with CT angio of chest  Loreli SlotSteven C Chavy Avera, MD [facility] [phone]

## 2018-09-12 ENCOUNTER — Encounter: Payer: Self-pay | Admitting: Physician Assistant

## 2018-09-14 ENCOUNTER — Telehealth: Payer: Self-pay | Admitting: Physician Assistant

## 2018-09-14 ENCOUNTER — Ambulatory Visit: Payer: BC Managed Care – PPO | Admitting: Physician Assistant

## 2018-09-14 NOTE — Telephone Encounter (Signed)
Contacted patient regarding upcoming routine office visit in light of ongoing COVID-19 pandemic.  The patient is not having any new or worsening symptoms or concerns.  He is agreeable to postponing the upcoming appointment until the COVID-19 outbreak is contained.  He has had a persistent cough for a couple of months.  I asked him to contact his pulmonologist as this may be related to his Sarcoidosis.  He has not had fever or shortness of breath.    PLAN:  1. Appt 09/14/2018 was canceled. 2. Please reschedule appt for 03/2019 with Dr. Excell Seltzer or Tereso Newcomer  3. Patient needs an echo 1 week prior to his appt (Dx - PFO s/p closure)  Tereso Newcomer, PA-C  09/14/2018 10:24 AM

## 2018-09-14 NOTE — Telephone Encounter (Signed)
Left message for patient to call back regarding upcoming routine office visit.   Tereso Newcomer, PA-C  09/14/2018 10:16 AM

## 2018-10-05 NOTE — Telephone Encounter (Signed)
Left message to call the office so that we may reschedule his appt with either Tereso Newcomer, PA or Dr. Excell Seltzer. Per PA pt will need to have an echo done 1 week prior to his appt with provider. I will route this message to Shea Evans, CMA for Tereso Newcomer, [place] for further follow up.

## 2019-03-16 ENCOUNTER — Other Ambulatory Visit (HOSPITAL_COMMUNITY): Payer: Self-pay | Admitting: Physician Assistant

## 2019-03-16 DIAGNOSIS — Q2112 Patent foramen ovale: Secondary | ICD-10-CM

## 2019-03-16 DIAGNOSIS — Q211 Atrial septal defect: Secondary | ICD-10-CM

## 2019-03-20 NOTE — Progress Notes (Signed)
Cardiology Office Note:    Date:  03/21/2019   ID:  Troy [name], DOB 23-Apr-1956, MRN [id number]  PCP:  Lujean Amel, MD  Cardiologist:  Sherren Mocha, MD   Electrophysiologist:  None  Thoracic surgeon: Dr. Roxan Hockey Pulmonologist: Dr. Lake Bells  Referring MD: Lujean Amel, MD   Chief Complaint  Patient presents with  . Follow-up    TAA, hx of PFO closure     History of Present Illness:    Troy [name] is a 63 y.o. male with:  PFO  S/p PFO closure 06/2015  Hx of recurrent TIAs prior to PFO closure  Thoracic aortic aneurysm   CT 12/19: 4.3 cm  Hypertension   Hyperlipidemia   Sarcoidosis   Mediastinal adenopathy; s/p Bx >> Path: non-caseating granulomas   Troy [name] was last seen by Dr. Burt Knack in 03/2017.  He returns for follow up.  A repeat echocardiogram was done just prior to his appt today.  He is doing well.  He has not had chest pain, significant shortness of breath, syncope.    Prior CV studies:   The following studies were reviewed today:  Chest CTA 05/31/18 IMPRESSION: Stable ascending thoracic aortic aneurysm. 4.2 x 4.3 cm  Recommend annual imaging followup by CTA or MRA. This recommendation follows 2010 ACCF/AHA/AATS/ACR/ASA/SCA/SCAI/SIR/STS/SVM Guidelines for the Diagnosis and Management of Patients with Thoracic Aortic Disease. Circulation. 2010; 121: Z660-Y301  Improvement in bulky mediastinal and hilar adenopathy when compared with the most recent exam from 10/15/2017. These have been previously biopsied and shown to represent granulomatous disease.  Multiple noncalcified nodules throughout both lungs. Majority of these are stable although some new small nodules are identified bilaterally as described. These are likely related to the underlying granulomatous disease in can be followed with the aneurysm follow-up protocol.  Aortic aneurysm NOS (ICD10-I71.9).   Echocardiogram 03/22/17 Mild focal basal septal hypertrophy, EF  55-60, no RWMA, Gr 1 DD, ascending aorta 43 mm, LA upper limits of nl, PFO closure device functioning normally  Past Medical History:  Diagnosis Date  . Cluster headaches    "q couple years" (07/15/2015)  . Depression    "mood stabilizer"  . Fibromyalgia   . Hypercholesterolemia   . Hypertension   . PFO with atrial septal aneurysm 07/15/2015   s/p 35 mm Amplatzer PFO device by Dr. Burt Knack 07/15/2015  . Stroke [facility]) 11/2014; 05/2015   denies residual from either on 07/15/2015   Surgical Hx: The patient  has a past surgical history that includes Shoulder open rotator cuff repair (Right, 1990); Knee arthroscopy (Right, 2013); Patent foramen ovale closure (07/15/2015); Cardiac catheterization (N/A, 07/15/2015); and Mediastinoscopy (N/A, 11/15/2017).   Current Medications: Current Meds  Medication Sig  . amLODipine (NORVASC) 2.5 MG tablet Take 2.5 mg by mouth daily.  Marland Kitchen aspirin EC 81 MG tablet Take 1 tablet (81 mg total) by mouth daily.  Marland Kitchen atorvastatin (LIPITOR) 20 MG tablet Take 20 mg by mouth daily.   Marland Kitchen PARoxetine (PAXIL) 10 MG tablet Take 10 mg by mouth daily.   Marland Kitchen triamterene-hydrochlorothiazide (MAXZIDE-25) 37.5-25 MG tablet Take 1 tablet by mouth daily.     Allergies:   Patient has no known allergies.   Social History   Tobacco Use  . Smoking status: Never Smoker  . Smokeless tobacco: Never Used  Substance Use Topics  . Alcohol use: Yes    Alcohol/week: 5.0 standard drinks    Types: 5 Cans of beer per week  . Drug use: No     Family Hx: The  patient's family history includes Headache in his father.  ROS:   Please see the history of present illness.    ROS All other systems reviewed and are negative.   EKGs/Labs/Other Test Reviewed:    EKG:  EKG is   ordered today.  The ekg ordered today demonstrates sinus bradycardia, heart rate 59, normal axis, first-degree AV block, PR 238, QTC 394, no change from prior tracing  Recent Labs: No results found for requested labs within  last 8760 hours.   Recent Lipid Panel Lab Results  Component Value Date/Time   CHOL 138 06/08/2015 05:00 PM   TRIG 52 06/08/2015 05:00 PM   HDL 60 06/08/2015 05:00 PM   CHOLHDL 2.3 06/08/2015 05:00 PM   LDLCALC 68 06/08/2015 05:00 PM     Physical Exam:    VS:  BP 122/90   Pulse (!) 59   Ht 5\' 10"  (1.778 m)   Wt 208 lb (94.3 kg)   BMI 29.84 kg/m     Wt Readings from Last 3 Encounters:  03/21/19 208 lb (94.3 kg)  05/31/18 211 lb (95.7 kg)  02/22/18 204 lb (92.5 kg)     Physical Exam  Constitutional: He is oriented to person, place, and time. He appears well-developed and well-nourished. No distress.  HENT:  Head: Normocephalic and atraumatic.  Eyes: No scleral icterus.  Neck: Neck supple. No JVD present. No thyromegaly present.  Cardiovascular: Normal rate, regular rhythm, S1 normal, S2 normal and normal heart sounds.  No murmur heard. Pulmonary/Chest: Effort normal and breath sounds normal. He has no rales.  Abdominal: Soft. There is no hepatomegaly.  Musculoskeletal:        General: No edema.  Lymphadenopathy:    He has no cervical adenopathy.  Neurological: He is alert and oriented to person, place, and time.  Skin: Skin is warm and dry.  Psychiatric: He has a normal mood and affect.    ASSESSMENT & PLAN:    1. PFO with atrial septal aneurysm Status post PFO closure in 2017.  Follow-up echo earlier this morning is still pending.  Continue aspirin therapy.  2. Thoracic aortic aneurysm without rupture (HCC) 4.3 cm by CT scan in December 2019.  He is followed by Dr. January 2020 with TCTS.  3. Sarcoidosis Asymptomatic.  Continue follow-up with pulmonology.  4. Essential hypertension His diastolic pressure is elevated.  I have asked him to check his blood pressure over the next couple of weeks and send me those readings for review.  Dispo:  Return in about 1 year (around 03/20/2020) for Routine Follow Up, w/ Dr. 03/22/2020, or Excell Seltzer, PA-C, (virtual or  in-person).   Medication Adjustments/Labs and Tests Ordered: Current medicines are reviewed at length with the patient today.  Concerns regarding medicines are outlined above.  Tests Ordered: Orders Placed This Encounter  Procedures  . EKG 12-Lead   Medication Changes: No orders of the defined types were placed in this encounter.   Signed, Tereso Newcomer, PA-C  03/21/2019 9:34 AM    [facility] [address] [place], [place], [place]  [place] Phone: [phone]; Fax: [phone]

## 2019-03-21 ENCOUNTER — Ambulatory Visit: Payer: BC Managed Care – PPO | Admitting: Physician Assistant

## 2019-03-21 ENCOUNTER — Ambulatory Visit (HOSPITAL_COMMUNITY): Payer: BC Managed Care – PPO | Attending: Cardiology

## 2019-03-21 ENCOUNTER — Encounter: Payer: Self-pay | Admitting: Physician Assistant

## 2019-03-21 ENCOUNTER — Other Ambulatory Visit: Payer: Self-pay

## 2019-03-21 VITALS — BP 122/90 | HR 59 | Ht 70.0 in | Wt 208.0 lb

## 2019-03-21 DIAGNOSIS — I712 Thoracic aortic aneurysm, without rupture, unspecified: Secondary | ICD-10-CM | POA: Insufficient documentation

## 2019-03-21 DIAGNOSIS — Q211 Atrial septal defect: Secondary | ICD-10-CM

## 2019-03-21 DIAGNOSIS — D869 Sarcoidosis, unspecified: Secondary | ICD-10-CM | POA: Diagnosis not present

## 2019-03-21 DIAGNOSIS — Q2112 Patent foramen ovale: Secondary | ICD-10-CM

## 2019-03-21 DIAGNOSIS — I10 Essential (primary) hypertension: Secondary | ICD-10-CM

## 2019-03-21 NOTE — Patient Instructions (Signed)
Medication Instructions:   Your physician recommends that you continue on your current medications as directed. Please refer to the Current Medication list given to you today.  If you need a refill on your cardiac medications before your next appointment, please call your pharmacy.   Lab work:  None ordered today  Testing/Procedures:  None ordered today  Follow-Up: At [facility], you and your health needs are our priority.  As part of our continuing mission to provide you with exceptional heart care, we have created designated Provider Care Teams.  These Care Teams include your primary Cardiologist (physician) and Advanced Practice Providers (APPs -  Physician Assistants and Nurse Practitioners) who all work together to provide you with the care you need, when you need it. You will need a follow up appointment in:  12 months.  Please call our office 2 months in advance to schedule this appointment.  You may see Michael Cooper, MD or one of the following Advanced Practice Providers on your designated Care Team: Scott Weaver, PA-C Vin Bhagat, PA-C . Janine Hammond, NP     

## 2019-05-11 ENCOUNTER — Other Ambulatory Visit: Payer: Self-pay | Admitting: Thoracic Surgery (Cardiothoracic Vascular Surgery)

## 2019-05-11 DIAGNOSIS — I712 Thoracic aortic aneurysm, without rupture, unspecified: Secondary | ICD-10-CM

## 2019-06-07 ENCOUNTER — Ambulatory Visit
Admission: RE | Admit: 2019-06-07 | Discharge: 2019-06-07 | Disposition: A | Discharge status: Home / Self Care | Payer: BC Managed Care – PPO | Source: Ambulatory Visit | Attending: Thoracic Surgery (Cardiothoracic Vascular Surgery) | Admitting: Thoracic Surgery (Cardiothoracic Vascular Surgery)

## 2019-06-07 DIAGNOSIS — I712 Thoracic aortic aneurysm, without rupture, unspecified: Secondary | ICD-10-CM

## 2019-06-07 MED ORDER — IOPAMIDOL (ISOVUE-370) INJECTION 76%
75.0000 mL | Freq: Once | INTRAVENOUS | Status: AC | PRN
  Administered 2019-06-07: 75 mL via INTRAVENOUS

## 2019-06-13 ENCOUNTER — Ambulatory Visit: Payer: BC Managed Care – PPO | Admitting: Thoracic Surgery (Cardiothoracic Vascular Surgery)

## 2019-06-15 ENCOUNTER — Encounter: Payer: Self-pay | Admitting: Thoracic Surgery (Cardiothoracic Vascular Surgery)

## 2019-06-15 ENCOUNTER — Other Ambulatory Visit: Payer: Self-pay

## 2019-06-15 ENCOUNTER — Ambulatory Visit: Payer: BC Managed Care – PPO | Admitting: Thoracic Surgery (Cardiothoracic Vascular Surgery)

## 2019-06-15 VITALS — BP 143/93 | HR 67 | Temp 97.5°F | Resp 16 | Ht 72.0 in | Wt 200.0 lb

## 2019-06-15 DIAGNOSIS — I712 Thoracic aortic aneurysm, without rupture: Secondary | ICD-10-CM

## 2019-06-15 DIAGNOSIS — I7121 Aneurysm of the ascending aorta, without rupture: Secondary | ICD-10-CM

## 2019-06-15 NOTE — Progress Notes (Signed)
[address]       [name] [address]             [phone]       HPI: Troy [name] for scheduled follow-up  Troy [name] is a 63 year old gentleman with a history of PFO status post Amplatzer device, stroke, hypertension, hyperlipidemia, mediastinal adenopathy secondary to sarcoidosis, and an ascending aortic aneurysm.  He was first noted to have an ascending aneurysm in October 2018.  He was also noted to have mediastinal and hilar adenopathy.  A mediastinoscopy in May 2019 showed sarcoidosis.  He was asymptomatic and did not require treatment.  We have been following him for both the aneurysm and the adenopathy.  He has been feeling well.  He has been doing a lot of painting and reaching up lately.  He occasionally notes a tingling in his left trapezius muscle when he is reaching overhead.  He has not noted it at any other time.  He is not having any chest pain, pressure, or tightness.  He has a blood pressure cuff at home but has not been checking himself on a regular basis.  Past Medical History:  Diagnosis Date  . Cluster headaches    "q couple years" (07/15/2015)  . Depression    "mood stabilizer"  . Fibromyalgia   . Hypercholesterolemia   . Hypertension   . PFO with atrial septal aneurysm 07/15/2015   s/p 35 mm Amplatzer PFO device by Dr. Excell Seltzer 07/15/2015 // Echocardiogram 02/2019: EF 55-60, Gr 1 DD, mod LAE, mild TR, mild to mod aortic sclerosis, ascending aorta 38 mm, PFO closure device present without residual shunt  . Stroke (HCC) 11/2014; 05/2015   denies residual from either on 07/15/2015  . Thoracic aortic aneurysm (HCC)    CT 12/19: 4.3 cm    Current Outpatient Medications  Medication Sig Dispense Refill  . amLODipine (NORVASC) 2.5 MG tablet Take 2.5 mg by mouth daily.    Marland Kitchen aspirin EC 81 MG tablet Take 1 tablet (81 mg total) by mouth daily. 30 tablet 0  . atorvastatin (LIPITOR) 20 MG tablet Take 20 mg by mouth daily.   7  . PARoxetine  (PAXIL) 10 MG tablet Take 10 mg by mouth daily.   0  . SUMAtriptan (IMITREX) 50 MG tablet Take 50 mg by mouth daily as needed.     . triamterene-hydrochlorothiazide (MAXZIDE-25) 37.5-25 MG tablet Take 1 tablet by mouth daily.  0   No current facility-administered medications for this visit.    Physical Exam BP (!) 143/93 (BP Location: Right Arm, Patient Position: Sitting, Cuff Size: Normal)   Pulse 67   Temp (!) 97.5 F (36.4 C) (Skin)   Resp 16   Ht 6' (1.829 m)   Wt 200 lb (90.7 kg)   SpO2 95% Comment: RA  BMI 27.23 kg/m  63 year old man in no acute distress Alert and oriented x3 with no focal deficits Lungs clear with equal breath sounds bilaterally Cardiac regular rate and rhythm, no murmur No cervical or subclavicular adenopathy No edema  Diagnostic Tests: CT ANGIOGRAPHY CHEST WITH CONTRAST  TECHNIQUE: Multidetector CT imaging of the chest was performed using the standard protocol during bolus administration of intravenous contrast. Multiplanar CT image reconstructions and MIPs were obtained to evaluate the vascular anatomy.  Creatinine was obtained on site at [facility] at [address]  Results: Creatinine 0.9 mg/dL.  GFR 89.  CONTRAST:  52mL ISOVUE-370 IOPAMIDOL (ISOVUE-370) INJECTION 76%  COMPARISON:  05/31/2018  FINDINGS: Cardiovascular: Heart is normal size. Evidence of patient's previous PFO closure. Known interval dilatation of the ascending thoracic aorta which measures 4.2 cm in AP diameter at the level of the main pulmonary artery unchanged. Aortic root measures 3.6 cm and sinotubular junction measures 3 cm. Aortic arch distal to the takeoff of the left subclavian artery as well as the descending thoracic aorta are normal in caliber. Pulmonary arterial system is unremarkable. Remaining vascular structures are normal.  Mediastinum/Nodes: Resolution of the previously seen mediastinal and hilar adenopathy in this patient with  known sarcoidosis. Remaining mediastinal structures are unremarkable.  Lungs/Pleura: Lungs are well inflated without focal airspace consolidation or effusion. Subtle patchy peripheral reticulonodular density over the posterior left upper lobe unchanged. Mm nodule slightly less prominent. Airways are normal.  Upper Abdomen: No acute findings.  Musculoskeletal: Minimal stable chronic anterior wedging of a midthoracic vertebral body.  Review of the MIP images confirms the above findings.  IMPRESSION: 1.  No acute cardiopulmonary disease.  2. Stable ascending thoracic aortic aneurysm measuring 4.2 cm in AP diameter. Recommend annual imaging followup by CTA or MRA. This recommendation follows 2010 ACCF/AHA/AATS/ACR/ASA/SCA/SCAI/SIR/STS/SVM Guidelines for the Diagnosis and Management of Patients with Thoracic Aortic Disease. Circulation. 2010; 121: S496-P591. Aortic aneurysm NOS (ICD10-I71.9). Aortic aneurysm NOS (ICD10-I71.9).  3. Few small stable pulmonary nodule opacities with largest measuring 4 mm over the lingula. No further workup needed. Resolution of mediastinal/hilar adenopathy compatible with known sarcoidosis.  4. Mild stable chronic anterior wedging of a midthoracic vertebral body.   Electronically Signed   By: Marin Olp M.D.   On: 06/07/2019 09:55 I personally reviewed the CT images and concur with the findings noted above.  Essentially complete resolution of his adenopathy.  No change in the 4.2 cm ascending aneurysm  Impression: Troy [name] is a 63 year old man with a past medical history significant for hypertension, hyperlipidemia, and ascending aortic aneurysm, stroke secondary to PFO, PFO closure with Amplatz device, and sarcoidosis.  Sarcoidosis with mediastinal and hilar adenopathy-his adenopathy has resolved.  He has not been on any treatment.  He does still have some small stable lung nodules.  Those are likely parenchymal lymph nodes.  Those  on the specific follow-up will be getting CTs to follow his aneurysm anyway.  Ascending aneurysm-stable at 4.2 cm.  Needs continued annual follow-up.  Hypertension-he has been compliant with medications.  His blood pressure is mildly elevated today.  He has not been checking his blood pressure at home on a regular basis.  I emphasized the importance of doing that and he understands the relationship to his aortic aneurysm.  Plan: Monitor blood pressure at home on a routine basis Return in 1 year with CT angio of chest  Melrose Nakayama, MD [facility] [phone]

## 2019-07-12 ENCOUNTER — Ambulatory Visit: Payer: BC Managed Care – PPO | Admitting: Emergency Medicine

## 2019-07-12 ENCOUNTER — Other Ambulatory Visit: Payer: Self-pay

## 2019-07-12 ENCOUNTER — Encounter: Payer: Self-pay | Admitting: Emergency Medicine

## 2019-07-12 DIAGNOSIS — R053 Chronic cough: Secondary | ICD-10-CM

## 2019-07-12 DIAGNOSIS — R05 Cough: Secondary | ICD-10-CM

## 2019-07-12 DIAGNOSIS — D869 Sarcoidosis, unspecified: Secondary | ICD-10-CM | POA: Diagnosis not present

## 2019-07-12 MED ORDER — ALBUTEROL SULFATE HFA 108 (90 BASE) MCG/ACT IN AERS: 2.0000 | INHALATION_SPRAY | Freq: Four times a day (QID) | RESPIRATORY_TRACT | 5 refills | Status: DC | PRN

## 2019-07-12 NOTE — Assessment & Plan Note (Signed)
Started when he had inhaled exposure to dust, drywall in November.  Improved temporarily with prednisone but has persisted.  Suspect upper airway in nature.  Question whether the Wixela might be contributing since its powdered formulation.  I will have him stop this at least for now.  Try to empirically treat for possible contribution of nasopharyngeal allergies.  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   Please stop Wixela for now We will try starting albuterol.  You can use 2 puffs if you need it for shortness of breath, chest tightness, spells of coughing. Try starting loratadine 10 mg once daily.  You can get this from the [facility] outpatient pharmacy OTC Try to avoid throat clearing if at all possible Wear a mask when you are exposed to dusts when you are working in your home

## 2019-07-12 NOTE — Assessment & Plan Note (Addendum)
With some associated mild obstruction based on curve of his flow volume loop on his pulmonary function testing, otherwise normal.  He was started on Wixela in the setting of his most recent cough syndrome, unclear that his obstruction is significant enough to require maintenance therapy.  I do want him to continue to keep albuterol available use if needed.  If his albuterol use increases, cough persists or dyspnea evolves then we could repeat pulmonary function testing, even consider provocation testing to elucidate.  We will follow your CT scan of the chest annually when you get this for your thoracic aortic aneurysm We will follow pulmonary function testing as needed depending on how your breathing changes over time Be sure to visit the ophthalmologist annually You need to get the COVID-19 vaccine when it becomes available Follow with Dr Delton Coombes in 6 months or sooner if you have any problems

## 2019-07-12 NOTE — Patient Instructions (Signed)
Please stop Wixela for now We will try starting albuterol.  You can use 2 puffs if you need it for shortness of breath, chest tightness, spells of coughing. Try starting loratadine 10 mg once daily.  You can get this from the [facility] OTC Try to avoid throat clearing if at all possible Wear a mask when you are exposed to dusts when you are working in your home We will follow your CT scan of the chest annually when you get this for your thoracic aortic aneurysm We will follow pulmonary function testing as needed depending on how your breathing changes over time Be sure to visit the ophthalmologist annually You need to get the COVID-19 vaccine when it becomes available Follow with Dr Delton Coombes in 6 months or sooner if you have any problems

## 2019-07-12 NOTE — Progress Notes (Signed)
Subjective:    Patient ID: Troy [name], male    DOB: Jul 18, 1955, 64 y.o.   MRN: [id number]  HPI 64 year old gentleman with a history of hypertension, hypercholesterolemia, CVA that revealed a PFO that was repaired by closure device in 2017.  His evaluation at that time revealed mediastinal lymphadenopathy, prompted mediastinoscopy that revealed noncaseating granulomas without any eosinophilia or positive cultures.  Diagnosis of sarcoidosis was given.  He had a CT scan of his chest done on 06/07/2019 to follow a thoracic aortic aneurysm which I have reviewed, shows resolution of his previously seen mediastinal and hilar lymphadenopathy, no evidence of consolidation, some subtle patchy reticulonodular density in the left upper lobe which is stable in size, less prominent nodular disease.  Most recent pulmonary function testing was 03/02/2018 that showed grossly normal airflows but with a borderline bronchodilator response and curve to his flow volume loop that could be consistent with some mild obstruction.  Volumes and diffusion capacity were normal.  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 Treated with pred by PCP and benefited but with some residual cough. He has UA irritation and is clearing his throat. No congestion. No GERD.    Review of Systems  Past Medical History:  Diagnosis Date  . Cluster headaches    "q couple years" (07/15/2015)  . Depression    "mood stabilizer"  . Fibromyalgia   . Hypercholesterolemia   . Hypertension   . PFO with atrial septal aneurysm 07/15/2015   s/p 35 mm Amplatzer PFO device by Dr. Excell Seltzer 07/15/2015 // Echocardiogram 02/2019: EF 55-60, Gr 1 DD, mod LAE, mild TR, mild to mod aortic sclerosis, ascending aorta 38 mm, PFO closure device present without residual shunt  . Stroke (HCC) 11/2014; 05/2015   denies residual from either on 07/15/2015  . Thoracic aortic aneurysm (HCC)    CT  12/19: 4.3 cm     Family History  Problem Relation Age of Onset  . Headache Father      Social History   Socioeconomic History  . Marital status: Married    Spouse name: Alona Bene  . Number of children: 1  . Years of education: PhD  . Highest education level: Not on file  Occupational History  . Occupation: [employer]  Tobacco Use  . Smoking status: Never Smoker  . Smokeless tobacco: Never Used  Substance and Sexual Activity  . Alcohol use: Yes    Alcohol/week: 5.0 standard drinks    Types: 5 Cans of beer per week  . Drug use: No  . Sexual activity: Not Currently  Other Topics Concern  . Not on file  Social History Narrative   Lives with wife Williemae Area from [place]. Moved about 5 years ago   Caffeine use: daily   Social Determinants of Health   Financial Resource Strain:   . Difficulty of Paying Living Expenses: Not on file  Food Insecurity:   . Worried About Programme researcher, broadcasting/film/video in the Last Year: Not on file  . Ran Out of Food in the Last Year: Not on file  Transportation Needs:   . Lack of Transportation (Medical): Not on file  . Lack of Transportation (Non-Medical): Not on file  Physical Activity:   . Days of Exercise per Week: Not on file  . Minutes of Exercise per Session: Not on file  Stress:   . Feeling of Stress : Not on file  Social Connections:   .  Frequency of Communication with Friends and Family: Not on file  . Frequency of Social Gatherings with Friends and Family: Not on file  . Attends Religious Services: Not on file  . Active Member of Clubs or Organizations: Not on file  . Attends Banker Meetings: Not on file  . Marital Status: Not on file  Intimate Partner Violence:   . Fear of Current or Ex-Partner: Not on file  . Emotionally Abused: Not on file  . Physically Abused: Not on file  . Sexually Abused: Not on file     No Known Allergies   Outpatient Medications Prior to Visit  Medication Sig Dispense Refill  . amLODipine  (NORVASC) 2.5 MG tablet Take 5 mg by mouth daily.     Marland Kitchen aspirin EC 81 MG tablet Take 1 tablet (81 mg total) by mouth daily. 30 tablet 0  . atorvastatin (LIPITOR) 20 MG tablet Take 20 mg by mouth daily.   7  . PARoxetine (PAXIL) 10 MG tablet Take 10 mg by mouth daily.   0  . SUMAtriptan (IMITREX) 50 MG tablet Take 50 mg by mouth daily as needed.     . triamterene-hydrochlorothiazide (MAXZIDE-25) 37.5-25 MG tablet Take 1 tablet by mouth daily.  0   No facility-administered medications prior to visit.        Objective:   Physical Exam Vitals:   07/12/19 0941  BP: 124/72  Pulse: 62  Temp: (!) 97.3 F (36.3 C)  TempSrc: Temporal  SpO2: 99%  Weight: 201 lb 9.6 oz (91.4 kg)  Height: 6' (1.829 m)   Gen: Pleasant, well-nourished, in no distress,  normal affect  ENT: No lesions,  mouth clear,  oropharynx clear, no postnasal drip  Neck: No JVD, no stridor  Lungs: No use of accessory muscles, no crackles or wheezing on normal respiration, no wheeze on forced expiration  Cardiovascular: RRR, heart sounds normal, no murmur or gallops, no peripheral edema  Musculoskeletal: No deformities, no cyanosis or clubbing  Neuro: alert, awake, non focal  Skin: Warm, no lesions or rash      Assessment & Plan:  Sarcoidosis With some associated mild obstruction based on curve of his flow volume loop on his pulmonary function testing, otherwise normal.  He was started on Wixela in the setting of his most recent cough syndrome, unclear that his obstruction is significant enough to require maintenance therapy.  I do want him to continue to keep albuterol available use if needed.  If his albuterol use increases, cough persists or dyspnea evolves then we could repeat pulmonary function testing, even consider provocation testing to elucidate.  We will follow your CT scan of the chest annually when you get this for your thoracic aortic aneurysm We will follow pulmonary function testing as needed  depending on how your breathing changes over time Be sure to visit the ophthalmologist annually You need to get the COVID-19 vaccine when it becomes available Follow with Dr Delton Coombes in 6 months or sooner if you have any problems   Chronic cough Started when he had inhaled exposure to dust, drywall in November.  Improved temporarily with prednisone but has persisted.  Suspect upper airway in nature.  Question whether the Wixela might be contributing since its powdered formulation.  I will have him stop this at least for now.  Try to empirically treat for possible contribution of nasopharyngeal allergies.  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  Please stop Wixela for now We will try starting albuterol.  You can use 2 puffs if you need it for shortness of breath, chest tightness, spells of coughing. Try starting loratadine 10 mg once daily.  You can get this from the [facility] OTC Try to avoid throat clearing if at all possible Wear a mask when you are exposed to dusts when you are working in your home  Baltazar Apo, MD, PhD 07/12/2019, 5:32 PM [facility] [phone] or if no answer [phone]

## 2019-07-20 ENCOUNTER — Other Ambulatory Visit: Payer: Self-pay

## 2019-07-20 ENCOUNTER — Encounter: Payer: Self-pay | Admitting: Emergency Medicine

## 2019-07-20 ENCOUNTER — Ambulatory Visit: Payer: BC Managed Care – PPO | Admitting: Emergency Medicine

## 2019-07-20 ENCOUNTER — Telehealth: Payer: Self-pay | Admitting: Emergency Medicine

## 2019-07-20 DIAGNOSIS — R05 Cough: Secondary | ICD-10-CM

## 2019-07-20 DIAGNOSIS — D869 Sarcoidosis, unspecified: Secondary | ICD-10-CM

## 2019-07-20 DIAGNOSIS — R053 Chronic cough: Secondary | ICD-10-CM

## 2019-07-20 LAB — CBC WITH DIFFERENTIAL/PLATELET
Basophils Absolute: 0.1 10*3/uL (ref 0.0–0.1)
Basophils Relative: 0.6 % (ref 0.0–3.0)
Eosinophils Absolute: 7.2 10*3/uL — ABNORMAL HIGH (ref 0.0–0.7)
Eosinophils Relative: 64.1 % — ABNORMAL HIGH (ref 0.0–5.0)
HCT: 42.3 % (ref 39.0–52.0)
Hemoglobin: 14.2 g/dL (ref 13.0–17.0)
Lymphocytes Relative: 12.7 % (ref 12.0–46.0)
Lymphs Abs: 1.4 10*3/uL (ref 0.7–4.0)
MCHC: 33.6 g/dL (ref 30.0–36.0)
MCV: 84.6 fl (ref 78.0–100.0)
Monocytes Absolute: 0.6 10*3/uL (ref 0.1–1.0)
Monocytes Relative: 5.1 % (ref 3.0–12.0)
Neutro Abs: 2 10*3/uL (ref 1.4–7.7)
Neutrophils Relative %: 17.5 % — ABNORMAL LOW (ref 43.0–77.0)
Platelets: 204 10*3/uL (ref 150.0–400.0)
RBC: 5 Mil/uL (ref 4.22–5.81)
RDW: 14.3 % (ref 11.5–15.5)
WBC: 11.2 10*3/uL — ABNORMAL HIGH (ref 4.0–10.5)

## 2019-07-20 MED ORDER — FLUTICASONE PROPIONATE 50 MCG/ACT NA SUSP: 2.0000 | Freq: Every day | NASAL | 5 refills | Status: DC

## 2019-07-20 MED ORDER — PANTOPRAZOLE SODIUM 40 MG PO TBEC: 40.0000 mg | DELAYED_RELEASE_TABLET | Freq: Every day | ORAL | 5 refills | Status: DC

## 2019-07-20 MED ORDER — PREDNISONE 10 MG PO TABS: ORAL_TABLET | ORAL | 0 refills | Status: DC

## 2019-07-20 NOTE — Telephone Encounter (Signed)
Spoke with pt. He is prednisone has been sent in. Nothing further was needed.

## 2019-07-20 NOTE — Assessment & Plan Note (Signed)
Nadine Counts returns today with significant worsening of his upper airway noise, dry cough and now dyspnea.  His symptoms and findings are most consistent with upper airway irritation.  I stopped his Wixela last time but would not expect discontinuation of the powdered inhaler to precipitate these problems.  In fact I would expect that stopping a powdered inhaler might help.  Must consider some true lower airways obstruction although not consistent with his exam.  I will check his IgE, eosinophil count, ACE level now.  Plan to treat him empirically with prednisone to calm his upper airway irritation.  Also add empiric GERD therapy, expand his rhinitis therapy to see if this will stabilize him.  If he continues to have symptoms then we will need to assess PFT, possibly repeat his imaging.  Blood work today. Please continue loratadine 10 mg once daily. Please start fluticasone nasal spray, 2 sprays each nostril once daily. Please start pantoprazole 40 mg once daily.  Take this medication either 1 hour before or 1 hour after eating. Please take prednisone as directed until completely gone. Keep your albuterol available to use 2 puffs if needed for shortness of breath, chest tightness, wheezing. Follow-up in 3 to 4 weeks with Dr. Delton Coombes.  Depending on how you are doing at that time we will adjust medications and we may decide to repeat your pulmonary function testing.

## 2019-07-20 NOTE — Patient Instructions (Signed)
Blood work today. Please continue loratadine 10 mg once daily. Please start fluticasone nasal spray, 2 sprays each nostril once daily. Please start pantoprazole 40 mg once daily.  Take this medication either 1 hour before or 1 hour after eating. Please take prednisone as directed until completely gone. Keep your albuterol available to use 2 puffs if needed for shortness of breath, chest tightness, wheezing. Follow-up in 3 to 4 weeks with Dr. Delton Coombes.  Depending on how you are doing at that time we will adjust medications and we may decide to repeat your pulmonary function testing.

## 2019-07-20 NOTE — Telephone Encounter (Signed)
Patient is waiting at Pharmacy.

## 2019-07-20 NOTE — Progress Notes (Signed)
Subjective:    Patient ID: Troy [name], male    DOB: 03/27/1956, 64 y.o.   MRN: [id number]  HPI 64 year old gentleman with a history of hypertension, hypercholesterolemia, CVA that revealed a PFO that was repaired by closure device in 2017.  His evaluation at that time revealed mediastinal lymphadenopathy, prompted mediastinoscopy that revealed noncaseating granulomas without any eosinophilia or positive cultures.  Diagnosis of sarcoidosis was given.  He had a CT scan of his chest done on 06/07/2019 to follow a thoracic aortic aneurysm which I have reviewed, shows resolution of his previously seen mediastinal and hilar lymphadenopathy, no evidence of consolidation, some subtle patchy reticulonodular density in the left upper lobe which is stable in size, less prominent nodular disease.  Most recent pulmonary function testing was 03/02/2018 that showed grossly normal airflows but with a borderline bronchodilator response and curve to his flow volume loop that could be consistent with some mild obstruction.  Volumes and diffusion capacity were normal.  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 Treated with pred by PCP and benefited but with some residual cough. He has UA irritation and is clearing his throat. No congestion. No GERD.   ROV 07/20/19 --follow-up visit for patient with history as outlined above, sarcoidosis, mild obstruction on spirometry.  He has been most bothered by chronic cough that seem to worsen when he had exposure to dust and drywall while doing work in his home starting in November.  Has been steroid responsive.  At our initial visit I asked him to stop Wixela as a potential upper airway irritant, started loratadine.  Today he reports evolving dyspnea, some wheeze, increased cough.    Review of Systems  Past Medical History:  Diagnosis Date  . Cluster headaches    "q couple years" (07/15/2015)  .  Depression    "mood stabilizer"  . Fibromyalgia   . Hypercholesterolemia   . Hypertension   . PFO with atrial septal aneurysm 07/15/2015   s/p 35 mm Amplatzer PFO device by Dr. Excell Seltzer 07/15/2015 // Echocardiogram 02/2019: EF 55-60, Gr 1 DD, mod LAE, mild TR, mild to mod aortic sclerosis, ascending aorta 38 mm, PFO closure device present without residual shunt  . Stroke (HCC) 11/2014; 05/2015   denies residual from either on 07/15/2015  . Thoracic aortic aneurysm (HCC)    CT 12/19: 4.3 cm     Family History  Problem Relation Age of Onset  . Headache Father      Social History   Socioeconomic History  . Marital status: Married    Spouse name: Alona Bene  . Number of children: 1  . Years of education: PhD  . Highest education level: Not on file  Occupational History  . Occupation: [employer]  Tobacco Use  . Smoking status: Never Smoker  . Smokeless tobacco: Never Used  Substance and Sexual Activity  . Alcohol use: Yes    Alcohol/week: 5.0 standard drinks    Types: 5 Cans of beer per week  . Drug use: No  . Sexual activity: Not Currently  Other Topics Concern  . Not on file  Social History Narrative   Lives with wife Williemae Area from [place]. Moved about 5 years ago   Caffeine use: daily   Social Determinants of Health   Financial Resource Strain:   . Difficulty of Paying Living Expenses: Not on file  Food Insecurity:   . Worried About Programme researcher, broadcasting/film/video  in the Last Year: Not on file  . Ran Out of Food in the Last Year: Not on file  Transportation Needs:   . Lack of Transportation (Medical): Not on file  . Lack of Transportation (Non-Medical): Not on file  Physical Activity:   . Days of Exercise per Week: Not on file  . Minutes of Exercise per Session: Not on file  Stress:   . Feeling of Stress : Not on file  Social Connections:   . Frequency of Communication with Friends and Family: Not on file  . Frequency of Social Gatherings with Friends and Family: Not on file  .  Attends Religious Services: Not on file  . Active Member of Clubs or Organizations: Not on file  . Attends Banker Meetings: Not on file  . Marital Status: Not on file  Intimate Partner Violence:   . Fear of Current or Ex-Partner: Not on file  . Emotionally Abused: Not on file  . Physically Abused: Not on file  . Sexually Abused: Not on file     No Known Allergies   Outpatient Medications Prior to Visit  Medication Sig Dispense Refill  . albuterol (VENTOLIN HFA) 108 (90 Base) MCG/ACT inhaler Inhale 2 puffs into the lungs every 6 (six) hours as needed for wheezing or shortness of breath. 18 g 5  . amLODipine (NORVASC) 2.5 MG tablet Take 5 mg by mouth daily.     Marland Kitchen aspirin EC 81 MG tablet Take 1 tablet (81 mg total) by mouth daily. 30 tablet 0  . atorvastatin (LIPITOR) 20 MG tablet Take 20 mg by mouth daily.   7  . PARoxetine (PAXIL) 10 MG tablet Take 10 mg by mouth daily.   0  . SUMAtriptan (IMITREX) 50 MG tablet Take 50 mg by mouth daily as needed.     . triamterene-hydrochlorothiazide (MAXZIDE-25) 37.5-25 MG tablet Take 1 tablet by mouth daily.  0   No facility-administered medications prior to visit.        Objective:   Physical Exam Vitals:   07/20/19 1333  BP: 134/84  Pulse: 74  SpO2: 94%  Weight: 203 lb (92.1 kg)  Height: 6' (1.829 m)    Gen: Pleasant, well-nourished, in no distress,  normal affect, intermittently coughing  ENT: No lesions,  mouth clear,  oropharynx clear, no postnasal drip  Neck: No JVD, loud insp and exp stridor  Lungs: No use of accessory muscles, loud referred UA noised on insp and to a lesser degree on exp  Cardiovascular: RRR, heart sounds normal, no murmur or gallops, no peripheral edema  Musculoskeletal: No deformities, no cyanosis or clubbing  Neuro: alert, awake, non focal  Skin: Warm, no lesions or rash        Assessment & Plan:  Chronic cough Nadine Counts returns today with significant worsening of his upper airway noise,  dry cough and now dyspnea.  His symptoms and findings are most consistent with upper airway irritation.  I stopped his Wixela last time but would not expect discontinuation of the powdered inhaler to precipitate these problems.  In fact I would expect that stopping a powdered inhaler might help.  Must consider some true lower airways obstruction although not consistent with his exam.  I will check his IgE, eosinophil count, ACE level now.  Plan to treat him empirically with prednisone to calm his upper airway irritation.  Also add empiric GERD therapy, expand his rhinitis therapy to see if this will stabilize him.  If he continues  to have symptoms then we will need to assess PFT, possibly repeat his imaging.  Blood work today. Please continue loratadine 10 mg once daily. Please start fluticasone nasal spray, 2 sprays each nostril once daily. Please start pantoprazole 40 mg once daily.  Take this medication either 1 hour before or 1 hour after eating. Please take prednisone as directed until completely gone. Keep your albuterol available to use 2 puffs if needed for shortness of breath, chest tightness, wheezing. Follow-up in 3 to 4 weeks with Dr. Lamonte Sakai.  Depending on how you are doing at that time we will adjust medications and we may decide to repeat your pulmonary function testing.  Sarcoidosis Plan to consider repeat PFT, possibly imaging depending on progress with the plans above  Baltazar Apo, MD, PhD 07/20/2019, 2:04 PM [facility] [phone] or if no answer [phone]

## 2019-07-20 NOTE — Assessment & Plan Note (Signed)
Plan to consider repeat PFT, possibly imaging depending on progress with the plans above

## 2019-07-21 LAB — ANGIOTENSIN CONVERTING ENZYME: Angiotensin-Converting Enzyme: 65 U/L (ref 9–67)

## 2019-07-21 LAB — IGE: IgE (Immunoglobulin E), Serum: 3998 kU/L — ABNORMAL HIGH (ref <=114)

## 2019-07-24 NOTE — Telephone Encounter (Signed)
Received MyChart message in regards to his labwork from 07/20/19. He wants to know what the elevated WBC and abnormal Ige means.   Dr. Delton Coombes, please advise. Thanks!

## 2019-07-24 NOTE — Telephone Encounter (Signed)
Left message for the patient with some general information, plan to discuss in more detail soon - - elevated IgE and eosinophil counts, high/normal ACE level.   May decide to check ANCA's, although no cystic or granulomatous disease on his recent CT chest; ? Whether this reflects asthma with an eosinophilic phenotype.   Will call him again.

## 2019-07-25 ENCOUNTER — Ambulatory Visit: Payer: BC Managed Care – PPO

## 2019-07-27 NOTE — Telephone Encounter (Addendum)
Dr Delton Coombes, the pt states he is available today at any time if you want to try calling him back, thanks [phone]

## 2019-07-28 NOTE — Telephone Encounter (Signed)
I called him, discussed results with him. We have follow up soon to see how he is doing after prednisone. May repeat eosinophil and IgE at that time, repeat CXR, probably check HSP labs.   Depending on course may need allergy referral, possibly heme referral.

## 2019-07-28 NOTE — Telephone Encounter (Signed)
mychart message sent by pt which is posted below:   To: [facility]OL    From: Janice Coffin "Nadine Counts"    Created: 07/28/2019 9:50 AM     *-*-*This message has not been handled.*-*-*  I'd love to hear from Dr Delton Coombes anytime today at [phone].   Thanks.  Nadine Counts     Dr. Delton Coombes, please advise as he would like to have you return call.

## 2019-08-03 ENCOUNTER — Ambulatory Visit: Payer: BC Managed Care – PPO | Attending: Internal Medicine

## 2019-08-03 DIAGNOSIS — Z23 Encounter for immunization: Secondary | ICD-10-CM | POA: Insufficient documentation

## 2019-08-03 NOTE — Progress Notes (Signed)
   Covid-19 Vaccination Clinic  Name:  Trace Wirick    MRN: [id number] DOB: 1955-11-07  08/03/2019  Mr. Elbaum was observed post Covid-19 immunization for 15 minutes without incidence. He was provided with Vaccine Information Sheet and instruction to access the V-Safe system.   Mr. Autry was instructed to call 911 with any severe reactions post vaccine: Marland Kitchen Difficulty breathing  . Swelling of your face and throat  . A fast heartbeat  . A bad rash all over your body  . Dizziness and weakness    Immunizations Administered    Name Date Dose VIS Date Route   Pfizer COVID-19 Vaccine 08/03/2019  5:25 PM 0.3 mL 06/09/2019 Intramuscular   Manufacturer: [employer], [employer]   Lot: [id number]   NDC: [address]-7

## 2019-08-10 ENCOUNTER — Other Ambulatory Visit: Payer: Self-pay

## 2019-08-10 ENCOUNTER — Ambulatory Visit: Payer: BC Managed Care – PPO | Admitting: Emergency Medicine

## 2019-08-10 ENCOUNTER — Encounter: Payer: Self-pay | Admitting: Emergency Medicine

## 2019-08-10 VITALS — BP 130/80 | HR 69 | Ht 71.0 in | Wt 205.0 lb

## 2019-08-10 DIAGNOSIS — R05 Cough: Secondary | ICD-10-CM | POA: Diagnosis not present

## 2019-08-10 DIAGNOSIS — D72119 Hypereosinophilic syndrome (hes), unspecified: Secondary | ICD-10-CM | POA: Diagnosis not present

## 2019-08-10 DIAGNOSIS — D869 Sarcoidosis, unspecified: Secondary | ICD-10-CM

## 2019-08-10 DIAGNOSIS — R053 Chronic cough: Secondary | ICD-10-CM

## 2019-08-10 DIAGNOSIS — D7219 Other eosinophilia: Secondary | ICD-10-CM

## 2019-08-10 DIAGNOSIS — D721 Eosinophilia, unspecified: Secondary | ICD-10-CM | POA: Insufficient documentation

## 2019-08-10 LAB — CBC WITH DIFFERENTIAL/PLATELET
Basophils Absolute: 0.1 10*3/uL (ref 0.0–0.1)
Basophils Relative: 0.8 % (ref 0.0–3.0)
Eosinophils Absolute: 2.1 10*3/uL — ABNORMAL HIGH (ref 0.0–0.7)
Eosinophils Relative: 31.9 % — ABNORMAL HIGH (ref 0.0–5.0)
HCT: 40.9 % (ref 39.0–52.0)
Hemoglobin: 13.6 g/dL (ref 13.0–17.0)
Lymphocytes Relative: 22.4 % (ref 12.0–46.0)
Lymphs Abs: 1.5 10*3/uL (ref 0.7–4.0)
MCHC: 33.3 g/dL (ref 30.0–36.0)
MCV: 85.5 fl (ref 78.0–100.0)
Monocytes Absolute: 0.5 10*3/uL (ref 0.1–1.0)
Monocytes Relative: 7.8 % (ref 3.0–12.0)
Neutro Abs: 2.4 10*3/uL (ref 1.4–7.7)
Neutrophils Relative %: 37.1 % — ABNORMAL LOW (ref 43.0–77.0)
Platelets: 188 10*3/uL (ref 150.0–400.0)
RBC: 4.79 Mil/uL (ref 4.22–5.81)
RDW: 14.3 % (ref 11.5–15.5)
WBC: 6.6 10*3/uL (ref 4.0–10.5)

## 2019-08-10 NOTE — Patient Instructions (Signed)
Repeat CBC and IgE today Ok to stop your pantoprazole when it runs out.  Continue your fluticasone nasal spray until it runs out Keep track of how your cough changes as we come off the medications.  Call our office to let us know which Hematologist you would like to see. Then we will make the referral.  We may decide to refer you to an allergist for specific allergen testing at some point depending on how your cough behaves. Get your Ct scan as planned by Dr Dorris Fetch.  Follow with Dr Delton Coombes in 3 months or sooner if you have any problems.

## 2019-08-10 NOTE — Assessment & Plan Note (Signed)
Lymphatic granulomatous disease noted after his thoracic surgery.  No eosinophils were noted on that pathology examination.  No evidence for associated interstitial disease.  We will follow his CT chest intermittently, already ordered by Dr. Dorris Fetch

## 2019-08-10 NOTE — Assessment & Plan Note (Signed)
Noted on his evaluation of cough and allergic disease.  Associated with a significantly elevated IgE.  Suspect that this is related to allergy but the values are quite high.  I believe he needs referral to hematology for evaluation of a possible eosinophilic syndrome.  He has investigated this some on his own and is interested in possibly going to [facility] or [facility].  I support this.  He will call me with information about the physician that he would like to see.  Once I have that information I will make the referral.

## 2019-08-10 NOTE — Progress Notes (Signed)
Subjective:    Patient ID: Troy [name], male    DOB: 1955/08/12, 64 y.o.   MRN: [id number]  HPI 64 year old gentleman with a history of hypertension, hypercholesterolemia, CVA that revealed a PFO that was repaired by closure device in 2017.  His evaluation at that time revealed mediastinal lymphadenopathy, prompted mediastinoscopy that revealed noncaseating granulomas without any eosinophilia or positive cultures.  Diagnosis of sarcoidosis was given.  He had a CT scan of his chest done on 06/07/2019 to follow a thoracic aortic aneurysm which I have reviewed, shows resolution of his previously seen mediastinal and hilar lymphadenopathy, no evidence of consolidation, some subtle patchy reticulonodular density in the left upper lobe which is stable in size, less prominent nodular disease.  Most recent pulmonary function testing was 03/02/2018 that showed grossly normal airflows but with a borderline bronchodilator response and curve to his flow volume loop that could be consistent with some mild obstruction.  Volumes and diffusion capacity were normal.  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 Treated with pred by PCP and benefited but with some residual cough. He has UA irritation and is clearing his throat. No congestion. No GERD.   ROV 07/20/19 --follow-up visit for patient with history as outlined above, sarcoidosis, mild obstruction on spirometry.  He has been most bothered by chronic cough that seem to worsen when he had exposure to dust and drywall while doing work in his home starting in November.  Has been steroid responsive.  At our initial visit I asked him to stop Wixela as a potential upper airway irritant, started loratadine.  Today he reports evolving dyspnea, some wheeze, increased cough.   ROV 08/10/2019 --follow-up visit for 64 year old hypertension, hypercholesterolemia, CVA with a PFO (closed 2017) diagnosed with  sarcoidosis by mediastinoscopy at that time.  Pathology showed noncaseating granulomas without any eosinophils or positive cultures.  I been seeing him for persistent cough that started in November 2020.  He has mild obstruction by spirometry embolus tried on Wixela to see if he would benefit.  I subsequently stopped this and started him on loratadine, fluticasone nasal spray, pantoprazole.  He also had a course of prednisone to see if we can alleviate his cough.  Labs from 07/20/2019: ACE level 65, WBC 11.2 with 64% eosinophils (7200), IgE 3998.   Review of Systems As above  Past Medical History:  Diagnosis Date  . Cluster headaches    "q couple years" (07/15/2015)  . Depression    "mood stabilizer"  . Fibromyalgia   . Hypercholesterolemia   . Hypertension   . PFO with atrial septal aneurysm 07/15/2015   s/p 35 mm Amplatzer PFO device by Dr. Excell Seltzer 07/15/2015 // Echocardiogram 02/2019: EF 55-60, Gr 1 DD, mod LAE, mild TR, mild to mod aortic sclerosis, ascending aorta 38 mm, PFO closure device present without residual shunt  . Stroke (HCC) 11/2014; 05/2015   denies residual from either on 07/15/2015  . Thoracic aortic aneurysm (HCC)    CT 12/19: 4.3 cm     Family History  Problem Relation Age of Onset  . Headache Father      Social History   Socioeconomic History  . Marital status: Married    Spouse name: Alona Bene  . Number of children: 1  . Years of education: PhD  . Highest education level: Not on file  Occupational History  . Occupation: [employer]  Tobacco Use  . Smoking status: Never Smoker  .  Smokeless tobacco: Never Used  Substance and Sexual Activity  . Alcohol use: Yes    Alcohol/week: 5.0 standard drinks    Types: 5 Cans of beer per week  . Drug use: No  . Sexual activity: Not Currently  Other Topics Concern  . Not on file  Social History Narrative   Lives with wife [name] from [place]. Moved about 5 years ago   Caffeine use: daily   Social Determinants of  Health   Financial Resource Strain:   . Difficulty of Paying Living Expenses: Not on file  Food Insecurity:   . Worried About Charity fundraiser in the Last Year: Not on file  . Ran Out of Food in the Last Year: Not on file  Transportation Needs:   . Lack of Transportation (Medical): Not on file  . Lack of Transportation (Non-Medical): Not on file  Physical Activity:   . Days of Exercise per Week: Not on file  . Minutes of Exercise per Session: Not on file  Stress:   . Feeling of Stress : Not on file  Social Connections:   . Frequency of Communication with Friends and Family: Not on file  . Frequency of Social Gatherings with Friends and Family: Not on file  . Attends Religious Services: Not on file  . Active Member of Clubs or Organizations: Not on file  . Attends Archivist Meetings: Not on file  . Marital Status: Not on file  Intimate Partner Violence:   . Fear of Current or Ex-Partner: Not on file  . Emotionally Abused: Not on file  . Physically Abused: Not on file  . Sexually Abused: Not on file     No Known Allergies   Outpatient Medications Prior to Visit  Medication Sig Dispense Refill  . albuterol (VENTOLIN HFA) 108 (90 Base) MCG/ACT inhaler Inhale 2 puffs into the lungs every 6 (six) hours as needed for wheezing or shortness of breath. 18 g 5  . amLODipine (NORVASC) 2.5 MG tablet Take 5 mg by mouth daily.     Marland Kitchen aspirin EC 81 MG tablet Take 1 tablet (81 mg total) by mouth daily. 30 tablet 0  . atorvastatin (LIPITOR) 20 MG tablet Take 20 mg by mouth daily.   7  . fluticasone (FLONASE) 50 MCG/ACT nasal spray Place 2 sprays into both nostrils daily. 16 g 5  . pantoprazole (PROTONIX) 40 MG tablet Take 1 tablet (40 mg total) by mouth daily. 30 tablet 5  . PARoxetine (PAXIL) 10 MG tablet Take 10 mg by mouth daily.   0  . SUMAtriptan (IMITREX) 50 MG tablet Take 50 mg by mouth daily as needed.     . triamterene-hydrochlorothiazide (MAXZIDE-25) 37.5-25 MG tablet  Take 1 tablet by mouth daily.  0  . predniSONE (DELTASONE) 10 MG tablet Take 4 tablets for 3 days, 3 tablets for 3 days, 2 tablets for 3 days, 1 tablet for 3 days 30 tablet 0   No facility-administered medications prior to visit.        Objective:   Physical Exam Vitals:   08/10/19 0903  BP: 130/80  Pulse: 69  SpO2: 99%  Weight: 205 lb (93 kg)  Height: 5\' 11"  (1.803 m)    Gen: Pleasant, well-nourished, in no distress,  normal affect, no coughing or throat clearing  ENT: No lesions,  mouth clear,  oropharynx clear, no postnasal drip  Neck: No JVD, stridor is completely resolved  Lungs: No use of accessory muscles,  no wheezing today  Cardiovascular: RRR, heart sounds normal, no murmur or gallops, no peripheral edema  Musculoskeletal: No deformities, no cyanosis or clubbing  Neuro: alert, awake, non focal  Skin: Warm, no lesions or rash        Assessment & Plan:  Eosinophilia Noted on his evaluation of cough and allergic disease.  Associated with a significantly elevated IgE.  Suspect that this is related to allergy but the values are quite high.  I believe he needs referral to hematology for evaluation of a possible eosinophilic syndrome.  He has investigated this some on his own and is interested in possibly going to [facility] or [facility].  I support this.  He will call me with information about the physician that he would like to see.  Once I have that information I will make the referral.  Chronic cough Significantly improved.  We gave a more extended course of prednisone, empirically treated possible GERD and allergic rhinitis.  Based on his eosinophilia and IgE I suspect that he may need to be on allergy medications long-term.  For now I think we can come off the PPI and see if he tolerates.  It may be reasonable to come off the allergy regimen as well if the cough does not recur.  If his cough does persist or return off allergy medication then he may need formal allergy referral  and skin testing.  Sarcoidosis Lymphatic granulomatous disease noted after his thoracic surgery.  No eosinophils were noted on that pathology examination.  No evidence for associated interstitial disease.  We will follow his CT chest intermittently, already ordered by Dr. Delle Reining, MD, PhD 08/10/2019, 2:52 PM [facility] [phone] or if no answer [phone]

## 2019-08-10 NOTE — Assessment & Plan Note (Signed)
Significantly improved.  We gave a more extended course of prednisone, empirically treated possible GERD and allergic rhinitis.  Based on his eosinophilia and IgE I suspect that he may need to be on allergy medications long-term.  For now I think we can come off the PPI and see if he tolerates.  It may be reasonable to come off the allergy regimen as well if the cough does not recur.  If his cough does persist or return off allergy medication then he may need formal allergy referral and skin testing.

## 2019-08-11 ENCOUNTER — Ambulatory Visit: Payer: BC Managed Care – PPO

## 2019-08-11 LAB — IGE: IgE (Immunoglobulin E), Serum: 3972 kU/L — ABNORMAL HIGH (ref <=114)

## 2019-08-18 DIAGNOSIS — D72119 Hypereosinophilic syndrome (hes), unspecified: Secondary | ICD-10-CM

## 2019-08-21 NOTE — Telephone Encounter (Signed)
RB please advise on pt emails: pt requesting referral To Troy [name] at [facility] to consult on eosinophilia.  Ok to place referral?  Troy [name], (first of 2 messages)  As we discussed when I last saw you, I'd like to request a referral to [name] ([email or web address]) for a hematological consult on my eosinophilia.  Dr [name] has already agreed to get me on her schedule in March and I will be calling her office on Monday to see when she can see me.  Here is the contact information for the Intake Specialist at Troy [name] office:  Allyson Roney-Oncology Leukemia, Multiple Myeloma (M-Z) [facility] Office: [phone] *Fax: [phone] [email or web address]  Dr [name], (message 2 of 2)  Can your office send the following information to Allyson, or do I need to pull some of this together and send it myself?  (1) lab data including CBC records (the recent ones and your last normal CBC prior to developing eosinophilia) and IgE testing (2) the path report from your sarcoid dx (3) chest CT reports from the recent one (showing improved sarcoid) and a prior one (that showed sarcoid lymph nodes) (4) recent clinic notes from your pulmonologist that contain his clinical assessment and treatments that he initiated (i.e., steroids)  Thanks so much,  Troy [name]   

## 2019-08-22 NOTE — Telephone Encounter (Signed)
Thank you everyone for arranging.  Yes please place referral and send our records, lab work, etc. as requested

## 2019-08-22 NOTE — Telephone Encounter (Signed)
Referral done and records faxed

## 2019-08-29 ENCOUNTER — Ambulatory Visit: Payer: BC Managed Care – PPO | Attending: Internal Medicine

## 2019-08-29 DIAGNOSIS — Z23 Encounter for immunization: Secondary | ICD-10-CM

## 2019-08-29 NOTE — Progress Notes (Signed)
   Covid-19 Vaccination Clinic  Name:  Isiac Breighner    MRN: [id number] DOB: 05-Nov-1955  08/29/2019  Mr. Broome was observed post Covid-19 immunization for 15 minutes without incident. He was provided with Vaccine Information Sheet and instruction to access the V-Safe system.   Mr. Hartt was instructed to call 911 with any severe reactions post vaccine: Marland Kitchen Difficulty breathing  . Swelling of face and throat  . A fast heartbeat  . A bad rash all over body  . Dizziness and weakness   Immunizations Administered    Name Date Dose VIS Date Route   Pfizer COVID-19 Vaccine 08/29/2019  8:43 AM 0.3 mL 06/09/2019 Intramuscular   Manufacturer: [employer], [employer]   Lot: [id number]   NDC: [address]-0

## 2019-09-15 ENCOUNTER — Encounter: Payer: Self-pay | Admitting: Emergency Medicine

## 2019-09-15 ENCOUNTER — Ambulatory Visit: Payer: BC Managed Care – PPO | Admitting: Emergency Medicine

## 2019-09-15 ENCOUNTER — Other Ambulatory Visit: Payer: Self-pay

## 2019-09-15 VITALS — BP 120/80 | HR 66

## 2019-09-15 DIAGNOSIS — J45909 Unspecified asthma, uncomplicated: Secondary | ICD-10-CM | POA: Insufficient documentation

## 2019-09-15 DIAGNOSIS — D869 Sarcoidosis, unspecified: Secondary | ICD-10-CM

## 2019-09-15 NOTE — Assessment & Plan Note (Signed)
Diagnosis is less certain at this point.  Unclear to me whether noncaseating granulomas are associated with Strongyloides;  I will have to investigate this.  No organisms were seen or reported on his cytology/pathology.  This still may be sarcoidosis, especially in absence of eosinophilia and the granulomas.

## 2019-09-15 NOTE — Patient Instructions (Addendum)
Agree with ivermectin as recommended by hematology at [facility]. I will check Aspergillus specific IgE now to add to the work-up. Agree with CBC, total IgE in a month and follow-up with hematology Keep albuterol available to use 2 puffs up to every 4 hours if needed for shortness of breath, chest tightness, wheezing.  Follow with Dr Delton Coombes in about 6 weeks (after hematology appointment).

## 2019-09-15 NOTE — Assessment & Plan Note (Signed)
Strongyloides antibody was positive, and he is planning to be treated with ivermectin.  Hopefully this will decrease his eosinophilia and IgE.  I will check an Aspergillus specific IgE to evaluate for possible ABPA although I do not see any focus of Aspergillus on his imaging that would be consistent with this process.

## 2019-09-15 NOTE — Progress Notes (Signed)
Subjective:    Patient ID: Troy [name], male    DOB: October 22, 1955, 64 y.o.   MRN: [id number]  HPI 65 year old gentleman with a history of hypertension, hypercholesterolemia, CVA that revealed a PFO that was repaired by closure device in 2017.  His evaluation at that time revealed mediastinal lymphadenopathy, prompted mediastinoscopy that revealed noncaseating granulomas without any eosinophilia or positive cultures.  Diagnosis of sarcoidosis was given.  He had a CT scan of his chest done on 06/07/2019 to follow a thoracic aortic aneurysm which I have reviewed, shows resolution of his previously seen mediastinal and hilar lymphadenopathy, no evidence of consolidation, some subtle patchy reticulonodular density in the left upper lobe which is stable in size, less prominent nodular disease.  Most recent pulmonary function testing was 03/02/2018 that showed grossly normal airflows but with a borderline bronchodilator response and curve to his flow volume loop that could be consistent with some mild obstruction.  Volumes and diffusion capacity were normal.  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 Treated with pred by PCP and benefited but with some residual cough. He has UA irritation and is clearing his throat. No congestion. No GERD.   ROV 07/20/19 --follow-up visit for patient with history as outlined above, sarcoidosis, mild obstruction on spirometry.  He has been most bothered by chronic cough that seem to worsen when he had exposure to dust and drywall while doing work in his home starting in November.  Has been steroid responsive.  At our initial visit I asked him to stop Wixela as a potential upper airway irritant, started loratadine.  Today he reports evolving dyspnea, some wheeze, increased cough.   ROV 08/10/2019 --follow-up visit for 64 year old hypertension, hypercholesterolemia, CVA with a PFO (closed 2017) diagnosed with  sarcoidosis by mediastinoscopy at that time.  Pathology showed noncaseating granulomas without any eosinophils or positive cultures.  I been seeing him for persistent cough that started in November 2020.  He has mild obstruction by spirometry embolus tried on Wixela to see if he would benefit.  I subsequently stopped this and started him on loratadine, fluticasone nasal spray, pantoprazole.  He also had a course of prednisone to see if we can alleviate his cough.  Labs from 07/20/2019: ACE level 65, WBC 11.2 with 64% eosinophils (7200), IgE 3998.  ROV 09/15/19 --Troy [name] is 55 and has a history of hypertension, hyperlipidemia, CVA with a PFO (closed).  He underwent mediastinoscopy that showed noncaseating granulomas without eosinophils or positive cultures.  Subsequently we discovered that he has profound eosinophilia, elevated IgE, high normal ACE level.  Based on this hematology evaluation was obtained.  He has been found to have positive Strongyloides antibody.  Unclear whether this is from a past infection or current infection but he does have an extensive travel history and was at risk.  Based on this he was started on ivermectin. Also note that he has MGUS, being followed by heme.   MDM:  -Reviewed labs and notes from hematology appointment at [facility]   Review of Systems As above      Objective:   Physical Exam Vitals:   09/15/19 1351  BP: 120/80  Pulse: 66  SpO2: 98%    Gen: Pleasant, well-nourished, in no distress,  normal affect, no coughing or throat clearing  ENT: No lesions,  mouth clear,  oropharynx clear, no postnasal drip  Neck: No JVD, stridor is completely resolved  Lungs: No use of accessory  muscles, no wheezing today  Cardiovascular: RRR, heart sounds normal, no murmur or gallops, no peripheral edema  Musculoskeletal: No deformities, no cyanosis or clubbing  Neuro: alert, awake, non focal  Skin: Warm, no lesions or rash        Assessment & Plan:   Eosinophilia Strongyloides antibody was positive, and he is planning to be treated with ivermectin.  Hopefully this will decrease his eosinophilia and IgE.  I will check an Aspergillus specific IgE to evaluate for possible ABPA although I do not see any focus of Aspergillus on his imaging that would be consistent with this process.  Asthma Mild obstruction based on his pulmonary function testing and he did have episodic symptoms that seem to be related to exposure.  Reasonable to continue albuterol as needed.  I will check Aspergillus IgE  Sarcoidosis Diagnosis is less certain at this point.  Unclear to me whether noncaseating granulomas are associated with Strongyloides;  I will have to investigate this.  No organisms were seen or reported on his cytology/pathology.  This still may be sarcoidosis, especially in absence of eosinophilia and the granulomas.  Baltazar Apo, MD, PhD 09/15/2019, 2:15 PM [facility] [phone] or if no answer [phone]

## 2019-09-15 NOTE — Assessment & Plan Note (Signed)
Mild obstruction based on his pulmonary function testing and he did have episodic symptoms that seem to be related to exposure.  Reasonable to continue albuterol as needed.  I will check Aspergillus IgE

## 2019-10-16 ENCOUNTER — Other Ambulatory Visit: Payer: Self-pay

## 2019-10-16 ENCOUNTER — Encounter: Payer: Self-pay | Admitting: Emergency Medicine

## 2019-10-16 ENCOUNTER — Ambulatory Visit: Payer: BC Managed Care – PPO | Admitting: Emergency Medicine

## 2019-10-16 DIAGNOSIS — D7219 Other eosinophilia: Secondary | ICD-10-CM

## 2019-10-16 DIAGNOSIS — D869 Sarcoidosis, unspecified: Secondary | ICD-10-CM | POA: Diagnosis not present

## 2019-10-16 NOTE — Progress Notes (Signed)
Subjective:    Patient ID: Troy [name], male    DOB: [birth year], 64 y.o.   MRN: [id number]  HPI 64 year old gentleman with a history of hypertension, hypercholesterolemia, CVA that revealed a PFO that was repaired by closure device in 2017.  His evaluation at that time revealed mediastinal lymphadenopathy, prompted mediastinoscopy that revealed noncaseating granulomas without any eosinophilia or positive cultures.  Diagnosis of sarcoidosis was given.  He had a CT scan of his chest done on 06/07/2019 to follow a thoracic aortic aneurysm which I have reviewed, shows resolution of his previously seen mediastinal and hilar lymphadenopathy, no evidence of consolidation, some subtle patchy reticulonodular density in the left upper lobe which is stable in size, less prominent nodular disease.  Most recent pulmonary function testing was 03/02/2018 that showed grossly normal airflows but with a borderline bronchodilator response and curve to his flow volume loop that could be consistent with some mild obstruction.  Volumes and diffusion capacity were normal.  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 Treated with pred by PCP and benefited but with some residual cough. He has UA irritation and is clearing his throat. No congestion. No GERD.   ROV 07/20/19 --follow-up visit for patient with history as outlined above, sarcoidosis, mild obstruction on spirometry.  He has been most bothered by chronic cough that seem to worsen when he had exposure to dust and drywall while doing work in his home starting in November.  Has been steroid responsive.  At our initial visit I asked him to stop Wixela as a potential upper airway irritant, started loratadine.  Today he reports evolving dyspnea, some wheeze, increased cough.   ROV 08/10/2019 --follow-up visit for 64 year old hypertension, hypercholesterolemia, CVA with a PFO (closed 2017) diagnosed with  sarcoidosis by mediastinoscopy at that time.  Pathology showed noncaseating granulomas without any eosinophils or positive cultures.  I been seeing him for persistent cough that started in November 2020.  He has mild obstruction by spirometry embolus tried on Wixela to see if he would benefit.  I subsequently stopped this and started him on loratadine, fluticasone nasal spray, pantoprazole.  He also had a course of prednisone to see if we can alleviate his cough.  Labs from 07/20/2019: ACE level 65, WBC 11.2 with 64% eosinophils (7200), IgE 3998.  ROV 09/15/19 --Troy [name] is 30 and has a history of hypertension, hyperlipidemia, CVA with a PFO (closed).  He underwent mediastinoscopy that showed noncaseating granulomas without eosinophils or positive cultures.  Subsequently we discovered that he has profound eosinophilia, elevated IgE, high normal ACE level.  Based on this hematology evaluation was obtained.  He has been found to have positive Strongyloides antibody.  Unclear whether this is from a past infection or current infection but he does have an extensive travel history and was at risk.  Based on this he was started on ivermectin. Also note that he has MGUS, being followed by heme.   MDM:  -Reviewed labs and notes from hematology appointment at [facility] 10/16/19 --64 year old gentleman with hypertension, hyperlipidemia, prior CVA, closed PFO and new diagnosis of sarcoidosis based on mediastinoscopy.  He also has profound peripheral eosinophilia and an elevated IgE that led to identification of Strongyloides antibody.  He was treated with ivermectin. He is feeling well - didn't really notice anything clinically when he was treated.   Most recent CBC + diff 10/11/19 > 25% eos, count 1600,   Aspergillus IgE  from 3/19 >> not reported    Review of Systems As above      Objective:   Physical Exam Vitals:   10/16/19 0944  BP: 140/78  Pulse: 71  SpO2: 99%  Weight: 209 lb (94.8 kg)  Height: 6' (1.829  m)    Gen: Pleasant, well-nourished, in no distress,  normal affect, no coughing or throat clearing  ENT: No lesions,  mouth clear,  oropharynx clear, no postnasal drip  Neck: No JVD, stridor is completely resolved  Lungs: No use of accessory muscles, no wheezing today  Cardiovascular: RRR, heart sounds normal, no murmur or gallops, no peripheral edema  Musculoskeletal: No deformities, no cyanosis or clubbing  Neuro: alert, awake, non focal  Skin: Warm, no lesions or rash        Assessment & Plan:  Eosinophilia He was treated with ivermectin for 2 days, standard therapy for Strongyloides.  His most recent labs from [place] do show improvement in his eosinophil count although not back down to the normal range.  Literature review does indicate that parasitic infections including Strongyloides could mimic sarcoidosis although there also cases reported where both diseases exist in the same patient.  I think we should get ID input as to whether we should expect full resolution of his eosinophilia by now, whether the 2-day treatment is adequate to take care of of chronic Strongyloides.  I will refer him to see Dr. Megan Salon.  I will also get the Aspergillus IgE results that were done on 3/19 but do not appear to be available in EPIC  Sarcoidosis At this point I presume that he does have sarcoidosis.  No organisms or eosinophils were seen in his granulomas.  Possible that his Strongyloides infection could be mimicking sarcoid.  I will plan to repeat  CT scan of the chest to follow lymphadenopathy, presumed sarcoidosis in December 2021.  Follow for interval stability/change.  Baltazar Apo, MD, PhD 10/16/2019, 1:53 PM [facility] [phone] or if no answer [phone]

## 2019-10-16 NOTE — Assessment & Plan Note (Signed)
At this point I presume that he does have sarcoidosis.  No organisms or eosinophils were seen in his granulomas.  Possible that his Strongyloides infection could be mimicking sarcoid.  I will plan to repeat  CT scan of the chest to follow lymphadenopathy, presumed sarcoidosis in December 2021.  Follow for interval stability/change.

## 2019-10-16 NOTE — Patient Instructions (Signed)
I am glad that you are eosinophil count has improved since you have been treated with ivermectin.  I will refer you to see Dr. Orvan Falconer with infectious diseases to discuss further. Follow-up with hematology as planned We will get the results of your Aspergillus IgE from [facility] We will plan to repeat your CT scan of the chest to follow lymphadenopathy, presumed sarcoidosis in December 2021. Follow with Dr. Delton Coombes in 2 months or sooner if you have any problems.

## 2019-10-16 NOTE — Assessment & Plan Note (Signed)
He was treated with ivermectin for 2 days, standard therapy for Strongyloides.  His most recent labs from [facility] do show improvement in his eosinophil count although not back down to the normal range.  Literature review does indicate that parasitic infections including Strongyloides could mimic sarcoidosis although there also cases reported where both diseases exist in the same patient.  I think we should get ID input as to whether we should expect full resolution of his eosinophilia by now, whether the 2-day treatment is adequate to take care of of chronic Strongyloides.  I will refer him to see Dr. Orvan Falconer.  I will also get the Aspergillus IgE results that were done on 3/19 but do not appear to be available in [facility]

## 2019-10-26 DIAGNOSIS — B789 Strongyloidiasis, unspecified: Secondary | ICD-10-CM

## 2019-11-02 ENCOUNTER — Ambulatory Visit: Payer: BC Managed Care – PPO | Admitting: Internal Medicine

## 2019-11-02 ENCOUNTER — Encounter: Payer: Self-pay | Admitting: Internal Medicine

## 2019-11-02 ENCOUNTER — Other Ambulatory Visit: Payer: Self-pay

## 2019-11-02 DIAGNOSIS — B789 Strongyloidiasis, unspecified: Secondary | ICD-10-CM | POA: Diagnosis not present

## 2019-11-02 MED ORDER — IVERMECTIN 3 MG PO TABS: 200.0000 ug/kg | ORAL_TABLET | Freq: Once | ORAL | 0 refills | Status: AC

## 2019-11-02 NOTE — Progress Notes (Signed)
[facility] for Infectious Disease      Reason for Consult: Strongyloides positive with eosinophilia    Referring Physician: Dr. Lamonte Sakai    Patient ID: Troy [name], male    DOB: Jul 28, 1955, 64 y.o.   MRN: [id number]  HPI:   He is here for evaluation of eosinophilia.   He has a history of significant travel to [place] with his work over the years and in about November, developed a dry cough and throat irritation.  On evaluation, he was noted to have a very elevated eosinophil count.  He has had lymphadenopathy and underwent a biopsy and noted non-caseating granulomatous disease with no eosinophilias.  Therefore there has been concern for sarcoidosis and has had elevated ACE levels.  He saw hematology due to the eosinophils and the one positive test with the Strongyloides Ab.  He was give ivermectin x 2 days.  Follow up CBC by report at [facility] was improved one month after treatment.   Past Medical History:  Diagnosis Date  . Cluster headaches    "q couple years" (07/15/2015)  . Depression    "mood stabilizer"  . Fibromyalgia   . Hypercholesterolemia   . Hypertension   . PFO with atrial septal aneurysm 07/15/2015   s/p 35 mm Amplatzer PFO device by Dr. Burt Knack 07/15/2015 // Echocardiogram 02/2019: EF 55-60, Gr 1 DD, mod LAE, mild TR, mild to mod aortic sclerosis, ascending aorta 38 mm, PFO closure device present without residual shunt  . Stroke ([place]) 11/2014; 05/2015   denies residual from either on 07/15/2015  . Thoracic aortic aneurysm ([place])    CT 12/19: 4.3 cm    Prior to Admission medications   Medication Sig Start Date End Date Taking? Authorizing Provider  albuterol (VENTOLIN HFA) 108 (90 Base) MCG/ACT inhaler Inhale 2 puffs into the lungs every 6 (six) hours as needed for wheezing or shortness of breath. 07/12/19   Collene Gobble, MD  amLODipine (NORVASC) 2.5 MG tablet Take 5 mg by mouth daily.  01/18/17   [provider]  aspirin EC 81 MG tablet Take 1 tablet (81 mg  total) by mouth daily. 06/10/15   Kelvin Cellar, MD  atorvastatin (LIPITOR) 20 MG tablet Take 20 mg by mouth daily.  04/04/15   [provider]  fluticasone (FLONASE) 50 MCG/ACT nasal spray Place 2 sprays into both nostrils daily. 07/20/19   Collene Gobble, MD  ivermectin (STROMECTOL) 3 MG TABS tablet Take 6.5 tablets (19,500 mcg total) by mouth once for 1 dose. 11/02/19 11/02/19  Thayer Headings, MD  pantoprazole (PROTONIX) 40 MG tablet Take 1 tablet (40 mg total) by mouth daily. 07/20/19   Collene Gobble, MD  PARoxetine (PAXIL) 10 MG tablet Take 10 mg by mouth daily.  04/04/15   [provider]  SUMAtriptan (IMITREX) 50 MG tablet Take 50 mg by mouth daily as needed.  02/12/16   [provider]  triamterene-hydrochlorothiazide (MAXZIDE-25) 37.5-25 MG tablet Take 1 tablet by mouth daily. 04/15/15   [provider]    No Known Allergies  Social History   Tobacco Use  . Smoking status: Never Smoker  . Smokeless tobacco: Never Used  Substance Use Topics  . Alcohol use: Yes    Alcohol/week: 5.0 standard drinks    Types: 5 Cans of beer per week  . Drug use: No    Family History  Problem Relation Age of Onset  . Headache Father     Review of Systems  Constitutional:  negative for fevers, chills, anorexia and weight loss Gastrointestinal: negative for diarrhea Musculoskeletal: negative for myalgias and arthralgias All other systems reviewed and are negative    Constitutional: in no apparent distress  Vitals:   11/02/19 0858  BP: (!) 143/95  Pulse: 61   EYES: anicteric Cardiovascular: Cor RRR Respiratory: clear GI: Bowel sounds are normal, liver is not enlarged, spleen is not enlarged Musculoskeletal: no edema Skin: negatives: no rash Neuro: non-focal  Labs: Lab Results  Component Value Date   WBC 6.6 08/10/2019   HGB 13.6 08/10/2019   HCT 40.9 08/10/2019   MCV 85.5 08/10/2019   PLT 188.0 08/10/2019    Lab Results  Component Value Date    CREATININE 0.94 11/12/2017   BUN 16 11/12/2017   NA 139 11/12/2017   K 4.0 11/12/2017   CL 105 11/12/2017   CO2 25 11/12/2017    Lab Results  Component Value Date   ALT 24 11/12/2017   AST 44 (H) 11/12/2017   ALKPHOS 44 11/12/2017   BILITOT 0.7 11/12/2017   INR 1.03 11/12/2017     Assessment: Strongyloides and eosinophilia.  I discussed some of the many causes of eosinophilia and helminth infection is possible.  Strongyloides does typically have eosinophilia and can increase IgE as well.  I do think it would be unusual though that previous CBCs have not noted eosinophilia until more recently.  He has had appropriate treatment already but I will give him one more dose of ivermectin now as well.  I will recheck his CBC today and in one month.  I do think with successful treatment, it should be resolved by next month and if not, should undergo further work up for other, non-infectious etiologies.   Plan: 1) CBC with diff now 2)ivermectin x 1 more dose 3) repeat CBC in 1 month

## 2019-11-03 LAB — CBC WITH DIFFERENTIAL/PLATELET
Absolute Monocytes: 461 cells/uL (ref 200–950)
Basophils Absolute: 49 cells/uL (ref 0–200)
Basophils Relative: 1 %
Eosinophils Absolute: 755 cells/uL — ABNORMAL HIGH (ref 15–500)
Eosinophils Relative: 15.4 %
HCT: 42.7 % (ref 38.5–50.0)
Hemoglobin: 14.2 g/dL (ref 13.2–17.1)
Lymphs Abs: 1357 cells/uL (ref 850–3900)
MCH: 28.6 pg (ref 27.0–33.0)
MCHC: 33.3 g/dL (ref 32.0–36.0)
MCV: 85.9 fL (ref 80.0–100.0)
MPV: 10.7 fL (ref 7.5–12.5)
Monocytes Relative: 9.4 %
Neutro Abs: 2279 cells/uL (ref 1500–7800)
Neutrophils Relative %: 46.5 %
Platelets: 174 10*3/uL (ref 140–400)
RBC: 4.97 10*6/uL (ref 4.20–5.80)
RDW: 12.5 % (ref 11.0–15.0)
Total Lymphocyte: 27.7 %
WBC: 4.9 10*3/uL (ref 3.8–10.8)

## 2019-11-03 LAB — IGE: IgE (Immunoglobulin E), Serum: 3050 kU/L — ABNORMAL HIGH (ref <=114)

## 2019-11-22 ENCOUNTER — Other Ambulatory Visit: Payer: Self-pay

## 2019-11-22 ENCOUNTER — Encounter: Payer: Self-pay | Admitting: Internal Medicine

## 2019-11-22 ENCOUNTER — Ambulatory Visit: Payer: BC Managed Care – PPO | Admitting: Internal Medicine

## 2019-11-22 VITALS — BP 122/75 | HR 65 | Temp 97.9°F | Ht 71.0 in | Wt 208.0 lb

## 2019-11-22 DIAGNOSIS — D7219 Other eosinophilia: Secondary | ICD-10-CM | POA: Diagnosis not present

## 2019-11-22 DIAGNOSIS — B789 Strongyloidiasis, unspecified: Secondary | ICD-10-CM

## 2019-11-22 LAB — CBC WITH DIFFERENTIAL/PLATELET
Absolute Monocytes: 419 cells/uL (ref 200–950)
Basophils Absolute: 58 cells/uL (ref 0–200)
Basophils Relative: 1.1 %
Eosinophils Absolute: 678 cells/uL — ABNORMAL HIGH (ref 15–500)
Eosinophils Relative: 12.8 %
HCT: 40.3 % (ref 38.5–50.0)
Hemoglobin: 13.4 g/dL (ref 13.2–17.1)
Lymphs Abs: 1362 cells/uL (ref 850–3900)
MCH: 28.6 pg (ref 27.0–33.0)
MCHC: 33.3 g/dL (ref 32.0–36.0)
MCV: 86.1 fL (ref 80.0–100.0)
MPV: 10.8 fL (ref 7.5–12.5)
Monocytes Relative: 7.9 %
Neutro Abs: 2783 cells/uL (ref 1500–7800)
Neutrophils Relative %: 52.5 %
Platelets: 196 10*3/uL (ref 140–400)
RBC: 4.68 10*6/uL (ref 4.20–5.80)
RDW: 12.5 % (ref 11.0–15.0)
Total Lymphocyte: 25.7 %
WBC: 5.3 10*3/uL (ref 3.8–10.8)

## 2019-11-22 NOTE — Assessment & Plan Note (Signed)
Now has been treated x 2 and anticipate cure.  Ab will remain positive so no test of cure.  If eosinophils ok, would be most c/w cure.

## 2019-11-22 NOTE — Assessment & Plan Note (Signed)
Will recheck his cbc today and if the eosinophils are normal or near normal, no further work up indicated.  If equivocal or elevated, will discuss with hematology if other work up indicated.

## 2019-11-22 NOTE — Progress Notes (Signed)
   Subjective:    Patient ID: Troy [name], male    DOB: 06-26-1956, 64 y.o.   MRN: [id number]  HPI Here for follow up of strongyloides and eosinophilia. He has a history of travel in [place] and recently noted to have an elevated eosinophil count.  Work up revealed a positive Strongyloides Ab and he was treated with ivermectin. Eosinophils did come down but not normal.  I saw him and gave him another dose of ivermectin and rechecked the CBC, which was notable for a decreasing eosinophil count.  No new symptoms.     Review of Systems  Constitutional: Negative for appetite change and unexpected weight change.  Gastrointestinal: Negative for diarrhea and nausea.  Skin: Negative for rash.       Objective:   Physical Exam Constitutional:      Appearance: Normal appearance.  Eyes:     General: No scleral icterus. Skin:    Findings: No rash.  Neurological:     Mental Status: He is alert.  Psychiatric:        Mood and Affect: Mood normal.           Assessment & Plan:

## 2019-12-18 ENCOUNTER — Other Ambulatory Visit: Payer: Self-pay

## 2019-12-18 ENCOUNTER — Telehealth (INDEPENDENT_AMBULATORY_CARE_PROVIDER_SITE_OTHER): Payer: BC Managed Care – PPO | Admitting: Emergency Medicine

## 2019-12-18 ENCOUNTER — Encounter: Payer: Self-pay | Admitting: Emergency Medicine

## 2019-12-18 DIAGNOSIS — D869 Sarcoidosis, unspecified: Secondary | ICD-10-CM

## 2019-12-18 NOTE — Progress Notes (Signed)
Virtual Visit via Video Note  I connected with Janice Coffin on 12/18/19 at  9:00 AM EDT by a video enabled telemedicine application and verified that I am speaking with the correct person using two identifiers.  Location: Patient: Home Provider: Office   I discussed the limitations of evaluation and management by telemedicine and the availability of in person appointments. The patient expressed understanding and agreed to proceed.  History of Present Illness: 64 year old man with hypertension, hypercholesterolemia, prior CVA with impaired PFO.  He has mediastinal lymphadenopathy and was found to have noncaseating granulomas without any eosinophilia or positive cultures on mediastinoscopy.  These been followed on serial imaging.  Also with peripheral eosinophilia led to work-up, identified Strongyloides for which she is now been treated.   Observations/Objective: IgE 11/02/2019 3050 (improved but elevated), eosinophil count 11/22/2019 12.8%, absolute 678 (improved)  Mr Kuiken did not log-on today - visit will need to be rescheduled.     Assessment and Plan: Strongyloides infection, treated twice, followed in ID.  Should be cured.  His eosinophil count has improved, still above the normal range.  Still has an elevated IgE.  Unclear as to whether any additional heme work-up needs to take place  Mediastinal hilar lymphadenopathy with noncaseating granulomas.  Unclear whether this was related to the Strongyloides use as there can be crossover between this infection and sarcoidosis.  At this point I am presuming that he does have sarcoidosis.  He needs a repeat CT scan of his chest and December 2021    Follow Up Instructions:  Patient did not log on - we will need to reschedule him for a f/u visit   I discussed the assessment and treatment plan with the patient. The patient was provided an opportunity to ask questions and all were answered. The patient agreed with the plan and demonstrated  an understanding of the instructions.   The patient was advised to call back or seek an in-person evaluation if the symptoms worsen or if the condition fails to improve as anticipated.  I provided 0 minutes of non-face-to-face time during this encounter.   Leslye Peer, MD

## 2019-12-20 ENCOUNTER — Encounter: Payer: Self-pay | Admitting: Emergency Medicine

## 2019-12-20 ENCOUNTER — Telehealth (INDEPENDENT_AMBULATORY_CARE_PROVIDER_SITE_OTHER): Payer: BC Managed Care – PPO | Admitting: Emergency Medicine

## 2019-12-20 DIAGNOSIS — D869 Sarcoidosis, unspecified: Secondary | ICD-10-CM | POA: Diagnosis not present

## 2019-12-20 NOTE — Progress Notes (Signed)
Virtual Visit via Video Note  I connected with Troy [name] on 12/20/19 at  3:00 PM EDT by a video enabled telemedicine application and verified that I am speaking with the correct person using two identifiers.  Location: Patient: Home Provider: Office   I discussed the limitations of evaluation and management by telemedicine and the availability of in person appointments. The patient expressed understanding and agreed to proceed.  History of Present Illness: 64 year old male with hypertension, hypercholesterolemia, prior CVA with repaired PFO.  I have seen him for mediastinal lymphadenopathy.  He was found to have noncaseating granulomas without any eosinophilia or positive cultures on mediastinoscopy.  We have followed on serial imaging.  He also had peripheral eosinophilia that led to a work-up that identified Strongyloides for which he has now been treated twice and seen ID.   Observations/Objective: IgE 11/02/2019 3050 (improved but elevated), eosinophil count 11/22/2019 12.8% with an absolute count of 678 (improved)  Assessment and Plan: Strongyloides infection now treated twice and followed by ID.  He should be cured.  His eosinophil count has improved is still above the normal range and he still has an elevated IgE.  Unclear as to whether any additional heme work-up needs to take place.  Also consider additional allergy work-up depending on symptoms.  We will likely need to follow intermittently. We will recheck his CBC, IgE.   Mediastinal hilar lymphadenopathy with noncaseating granulomas.  There can be crossover between Strongyloides and sarcoid so is difficult for me to say whether these findings were all related to the infection.  At this point I think we have to presume that he does still have sarcoid.  I did repeat his CT scan of the chest in December 2021 to follow for interval stability.  Follow Up Instructions: December after CT chest    I discussed the assessment and  treatment plan with the patient. The patient was provided an opportunity to ask questions and all were answered. The patient agreed with the plan and demonstrated an understanding of the instructions.   The patient was advised to call back or seek an in-person evaluation if the symptoms worsen or if the condition fails to improve as anticipated.  I provided 25 minutes of non-face-to-face time during this encounter.   Leslye Peer, MD

## 2019-12-25 ENCOUNTER — Other Ambulatory Visit: Payer: BC Managed Care – PPO

## 2020-01-08 ENCOUNTER — Other Ambulatory Visit: Payer: Self-pay

## 2020-01-08 ENCOUNTER — Ambulatory Visit (INDEPENDENT_AMBULATORY_CARE_PROVIDER_SITE_OTHER)
Admission: RE | Admit: 2020-01-08 | Discharge: 2020-01-08 | Disposition: A | Discharge status: Home / Self Care | Payer: BC Managed Care – PPO | Source: Ambulatory Visit | Attending: Emergency Medicine | Admitting: Emergency Medicine

## 2020-01-08 DIAGNOSIS — D869 Sarcoidosis, unspecified: Secondary | ICD-10-CM

## 2020-01-19 ENCOUNTER — Telehealth: Payer: Self-pay | Admitting: *Deleted

## 2020-01-19 NOTE — Telephone Encounter (Signed)
   [facility] Pre-operative Risk Assessment    HEARTCARE STAFF: - Please ensure there is not already an duplicate clearance open for this procedure. - Under Visit Info/Reason for Call, type in Other and utilize the format Clearance MM/DD/YY or Clearance TBD. Do not use dashes or single digits. - If request is for dental extraction, please clarify the # of teeth to be extracted.  Request for surgical clearance:  1. What type of surgery is being performed?  RIGHT TOTAL KNEE ARTHROPLASTY   2. When is this surgery scheduled?  03/18/20   3. What type of clearance is required (medical clearance vs. Pharmacy clearance to hold med vs. Both)?  BOTH  4. Are there any medications that need to be held prior to surgery and how long? ASPIRIN   5. Practice name and name of physician performing surgery?  [facility] / DR. OLIN   6. What is the office phone number?  [phone]   7.   What is the office fax number?  [phone]  8.   Anesthesia type (None, local, MAC, general) ?  SPINAL   Jeanann Lewandowsky 01/19/2020, 11:58 AM  _________________________________________________________________   (provider comments below)

## 2020-01-19 NOTE — Telephone Encounter (Signed)
Primary Cardiologist:Michael Excell Seltzer, MD  Chart reviewed as part of pre-operative protocol coverage. Because of Troy [name]'s past medical history and time since last visit, he/she will require a follow-up visit in order to better assess preoperative cardiovascular risk.  Pre-op covering staff: - Please schedule appointment and call patient to inform them. - Please contact requesting surgeon's office via preferred method (i.e, phone, fax) to inform them of need for appointment prior to surgery.  If applicable, this message will also be routed to pharmacy pool and/or primary cardiologist for input on holding anticoagulant/antiplatelet agent as requested below so that this information is available at time of patient's appointment.   Ronney Asters, NP  01/19/2020, 12:30 PM

## 2020-01-19 NOTE — Telephone Encounter (Signed)
Forwarded to requesting party via Epic fax function 

## 2020-01-19 NOTE — Telephone Encounter (Signed)
Called pt and scheduled appt for pre-op clearance on 03/06/2020 @ 2:45 PM with Chelsea Aus, PA

## 2020-03-06 ENCOUNTER — Other Ambulatory Visit: Payer: Self-pay

## 2020-03-06 ENCOUNTER — Encounter: Payer: Self-pay | Admitting: Physician Assistant

## 2020-03-06 ENCOUNTER — Ambulatory Visit: Payer: BC Managed Care – PPO | Admitting: Physician Assistant

## 2020-03-06 VITALS — BP 130/90 | HR 61 | Ht 71.0 in | Wt 208.2 lb

## 2020-03-06 DIAGNOSIS — I253 Aneurysm of heart: Secondary | ICD-10-CM

## 2020-03-06 DIAGNOSIS — I10 Essential (primary) hypertension: Secondary | ICD-10-CM

## 2020-03-06 DIAGNOSIS — D869 Sarcoidosis, unspecified: Secondary | ICD-10-CM

## 2020-03-06 DIAGNOSIS — I712 Thoracic aortic aneurysm, without rupture, unspecified: Secondary | ICD-10-CM

## 2020-03-06 DIAGNOSIS — Q211 Atrial septal defect: Secondary | ICD-10-CM

## 2020-03-06 DIAGNOSIS — Z01810 Encounter for preprocedural cardiovascular examination: Secondary | ICD-10-CM

## 2020-03-06 NOTE — Patient Instructions (Signed)

## 2020-03-06 NOTE — Progress Notes (Signed)
Cardiology Office Note    Date:  03/06/2020   ID:  Troy [name], DOB 1956-06-01, MRN [id number]  PCP:  Troy Bussing, MD  Cardiologist:  Dr. Excell Seltzer  Chief Complaint: Surgical clearance for right total knee arthroplasty  History of Present Illness:   Troy [name] is a 64 y.o. male with history of recurrent TIAs prior to PFO closure January 2017, thoracic aortic aneurysm, sarcoidosis, hypertension, strongyloides/eosinophilia (followed by ID)  and hyperlipidemia presents for surgical clearance.  Last echocardiogram September 2020 showed normal LV function with mild stiffness.  PFO closure without residual shunt.  Last chest CT 12/2019  showed stable ectatic 4.4 cm ascending thoracic aorta.  Here today for surgical clearance.  Patient is very active.  He did renovation to his house without any issue.  He does strengthening exercises on his leg.  He denies chest pain, shortness of breath, orthopnea, PND, syncope, lower extremity edema or melena.  Past Medical History:  Diagnosis Date  . Cluster headaches    "q couple years" (07/15/2015)  . Depression    "mood stabilizer"  . Fibromyalgia   . Hypercholesterolemia   . Hypertension   . PFO with atrial septal aneurysm 07/15/2015   s/p 35 mm Amplatzer PFO device by Dr. Excell Seltzer 07/15/2015 // Echocardiogram 02/2019: EF 55-60, Gr 1 DD, mod LAE, mild TR, mild to mod aortic sclerosis, ascending aorta 38 mm, PFO closure device present without residual shunt  . Stroke (HCC) 11/2014; 05/2015   denies residual from either on 07/15/2015  . Thoracic aortic aneurysm (HCC)    CT 12/19: 4.3 cm    Past Surgical History:  Procedure Laterality Date  . CARDIAC CATHETERIZATION N/A 07/15/2015   Procedure: PFO Closure;  Surgeon: Tonny Bollman, MD;  Location: [facility] INVASIVE CV LAB;  Service: Cardiovascular;  Laterality: N/A;  . KNEE ARTHROSCOPY Right 2013   "meniscus repair"  . MEDIASTINOSCOPY N/A 11/15/2017   Procedure: MEDIASTINOSCOPY;  Surgeon:  Loreli Slot, MD;  Location: [facility] OR;  Service: Thoracic;  Laterality: N/A;  . PATENT FORAMEN OVALE CLOSURE  07/15/2015  . SHOULDER OPEN ROTATOR CUFF REPAIR Right 1990    Current Medications: Prior to Admission medications   Medication Sig Start Date End Date Taking? Authorizing Provider  albuterol (VENTOLIN HFA) 108 (90 Base) MCG/ACT inhaler Inhale 2 puffs into the lungs every 6 (six) hours as needed for wheezing or shortness of breath. 07/12/19   Leslye Peer, MD  amLODipine (NORVASC) 2.5 MG tablet Take 5 mg by mouth daily.  01/18/17   [provider]  aspirin EC 81 MG tablet Take 1 tablet (81 mg total) by mouth daily. 06/10/15   Jeralyn Bennett, MD  atorvastatin (LIPITOR) 20 MG tablet Take 20 mg by mouth daily.  04/04/15   [provider]  PARoxetine (PAXIL) 10 MG tablet Take 10 mg by mouth daily.  04/04/15   [provider]  SUMAtriptan (IMITREX) 50 MG tablet Take 50 mg by mouth daily as needed.  02/12/16   [provider]  triamterene-hydrochlorothiazide (MAXZIDE-25) 37.5-25 MG tablet Take 1 tablet by mouth daily. 04/15/15   [provider]    Allergies:   Patient has no known allergies.   Social History   Socioeconomic History  . Marital status: Married    Spouse name: Troy [name]  . Number of children: 1  . Years of education: PhD  . Highest education level: Not on file  Occupational History  . Occupation: [employer]  Tobacco Use  . Smoking status: Never Smoker  .  Smokeless tobacco: Never Used  Vaping Use  . Vaping Use: Never used  Substance and Sexual Activity  . Alcohol use: Yes    Alcohol/week: 5.0 standard drinks    Types: 5 Cans of beer per week  . Drug use: No  . Sexual activity: Not Currently  Other Topics Concern  . Not on file  Social History Narrative   Lives with wife Troy [name]   Moved from [place]. Moved about 5 years ago   Caffeine use: daily   Social Determinants of Health   Financial Resource Strain:   .  Difficulty of Paying Living Expenses: Not on file  Food Insecurity:   . Worried About Programme researcher, broadcasting/film/videounning Out of Food in the Last Year: Not on file  . Ran Out of Food in the Last Year: Not on file  Transportation Needs:   . Lack of Transportation (Medical): Not on file  . Lack of Transportation (Non-Medical): Not on file  Physical Activity:   . Days of Exercise per Week: Not on file  . Minutes of Exercise per Session: Not on file  Stress:   . Feeling of Stress : Not on file  Social Connections:   . Frequency of Communication with Friends and Family: Not on file  . Frequency of Social Gatherings with Friends and Family: Not on file  . Attends Religious Services: Not on file  . Active Member of Clubs or Organizations: Not on file  . Attends BankerClub or Organization Meetings: Not on file  . Marital Status: Not on file     Family History:  The patient's family history includes Headache in his father.  ROS:   Please see the history of present illness.    ROS All other systems reviewed and are negative.   PHYSICAL EXAM:   VS:  BP 130/90   Pulse 61   Ht 5\' 11"  (1.803 m)   Wt 208 lb 3.2 oz (94.4 kg)   SpO2 96%   BMI 29.04 kg/m    GEN: Well nourished, well developed, in no acute distress  HEENT: normal  Neck: no JVD, carotid bruits, or masses Cardiac: RRR; no murmurs, rubs, or gallops,no edema  Respiratory:  clear to auscultation bilaterally, normal work of breathing GI: soft, nontender, nondistended, + BS MS: no deformity or atrophy  Skin: warm and dry, no rash Neuro:  Alert and Oriented x 3, Strength and sensation are intact Psych: euthymic mood, full affect  Wt Readings from Last 3 Encounters:  03/06/20 208 lb 3.2 oz (94.4 kg)  11/22/19 208 lb (94.3 kg)  11/02/19 207 lb (93.9 kg)      Studies/Labs Reviewed:   EKG:  EKG is ordered today.  The ekg ordered today demonstrates NSR at rate of 61 bpm  Recent Labs: 11/22/2019: Hemoglobin 13.4; Platelets 196   Lipid Panel    Component  Value Date/Time   CHOL 138 06/08/2015 1700   TRIG 52 06/08/2015 1700   HDL 60 06/08/2015 1700   CHOLHDL 2.3 06/08/2015 1700   VLDL 10 06/08/2015 1700   LDLCALC 68 06/08/2015 1700    Additional studies/ records that were reviewed today include:   Echocardiogram: 03/21/2019 1. Left ventricular ejection fraction, by visual estimation, is 55 to  60%. The left ventricle has normal function. Mildly increased left  ventricular size. There is no left ventricular hypertrophy.  2. Left ventricular diastolic Doppler parameters are consistent with  impaired relaxation pattern of LV diastolic filling.  3. Global right ventricle has normal systolic function.The right  ventricular size is normal. No increase in right ventricular wall  thickness.  4. Left atrial size was moderately dilated.  5. Right atrial size was normal.  6. The mitral valve is normal in structure. No evidence of mitral valve  regurgitation. No evidence of mitral stenosis.  7. The tricuspid valve is normal in structure. Tricuspid valve  regurgitation is mild.  8. The aortic valve is normal in structure. Aortic valve regurgitation  was not visualized by color flow Doppler. Mild to moderate aortic valve  sclerosis/calcification without any evidence of aortic stenosis.  9. The pulmonic valve was normal in structure. Pulmonic valve  regurgitation is not visualized by color flow Doppler.  10. The inferior vena cava is normal in size with greater than 50%  respiratory variability, suggesting right atrial pressure of 3 mmHg.      ASSESSMENT & PLAN:    1. S/p PFO closure 06/2015 -No shunt by last echo 02/2019.   2. Thoracic aortic aneurysm - Followed by Dr. Dorris Fetch - Stable at 4.4 cm by CT of chest 12/2019  3. HTN - Stable on current medications.   4. Sarcoidosis with mediastinal lymphadenopathy - Followed by pulmonary  5. Surgical clearance  - He is easily getting > 4 mets of activity. Given past medical  history and time since last visit, based on ACC/AHA guidelines, Darcey Demma would be at acceptable risk for the planned procedure without further cardiovascular testing.   The patient was advised that if he develops new symptoms prior to surgery to contact our office to arrange for a follow-up visit, and he verbalized understanding.  Okay to hold ASA 5-7 days prior to surgery.   I will route this recommendation to the requesting party via Epic fax function and remove from pre-op pool.  Please call with questions.  [place], [place] 03/06/2020, 3:35 PM       Medication Adjustments/Labs and Tests Ordered: Current medicines are reviewed at length with the patient today.  Concerns regarding medicines are outlined above.  Medication changes, Labs and Tests ordered today are listed in the Patient Instructions below. Patient Instructions  Medication Instructions:  Your physician recommends that you continue on your current medications as directed. Please refer to the Current Medication list given to you today.  *If you need a refill on your cardiac medications before your next appointment, please call your pharmacy*   Lab Work: None ordered  If you have labs (blood work) drawn today and your tests are completely normal, you will receive your results only by: Marland Kitchen MyChart Message (if you have MyChart) OR . A paper copy in the mail If you have any lab test that is abnormal or we need to change your treatment, we will call you to review the results.   Testing/Procedures: None ordered   Follow-Up: At [facility], you and your health needs are our priority.  As part of our continuing mission to provide you with exceptional heart care, we have created designated Provider Care Teams.  These Care Teams include your primary Cardiologist (physician) and Advanced Practice Providers (APPs -  Physician Assistants and Nurse Practitioners) who all work together to provide you with the care  you need, when you need it.  We recommend signing up for the patient portal called "MyChart".  Sign up information is provided on this After Visit Summary.  MyChart is used to connect with patients for Virtual Visits (Telemedicine).  Patients are able to view lab/test results, encounter notes, upcoming appointments, etc.  Non-urgent messages can be sent to your provider as well.   To learn more about what you can do with MyChart, go to [email or web address].    Your next appointment:   12 month(s)  The format for your next appointment:   In Person  Provider:   You may see Tonny Bollman, MD or one of the following Advanced Practice Providers on your designated Care Team:    Tereso Newcomer, PA-C  Chelsea Aus, PA-C    Other Instructions      Signed, Manson Passey, [place]  03/06/2020 3:35 PM    [facility] [address] [place], [place], [place]  [address] Phone: [phone]; Fax: [phone]

## 2020-04-02 ENCOUNTER — Other Ambulatory Visit: Payer: Self-pay | Admitting: Family Medicine

## 2020-04-02 DIAGNOSIS — R7401 Elevation of levels of liver transaminase levels: Secondary | ICD-10-CM

## 2020-04-04 ENCOUNTER — Ambulatory Visit
Admission: RE | Admit: 2020-04-04 | Discharge: 2020-04-04 | Disposition: A | Discharge status: Home / Self Care | Payer: BC Managed Care – PPO | Source: Ambulatory Visit | Attending: Family Medicine | Admitting: Family Medicine

## 2020-04-04 DIAGNOSIS — R7401 Elevation of levels of liver transaminase levels: Secondary | ICD-10-CM

## 2020-04-18 ENCOUNTER — Telehealth: Payer: Self-pay | Admitting: *Deleted

## 2020-04-18 NOTE — Telephone Encounter (Signed)
   [facility] Pre-operative Risk Assessment    HEARTCARE STAFF: - Please ensure there is not already an duplicate clearance open for this procedure. - Under Visit Info/Reason for Call, type in Other and utilize the format Clearance MM/DD/YY or Clearance TBD. Do not use dashes or single digits. - If request is for dental extraction, please clarify the # of teeth to be extracted.  Request for surgical clearance:  1. What type of surgery is being performed? LEFT TOTAL KNEE ARTHROPLASTY   2. When is this surgery scheduled? 06/03/20   3. What type of clearance is required (medical clearance vs. Pharmacy clearance to hold med vs. Both)? MEDICAL  4. Are there any medications that need to be held prior to surgery and how long? ASA    5. Practice name and name of physician performing surgery? [facility]; DR. [place]   6. What is the office phone number? [phone]   7.   What is the office fax number? [phone]  8.   Anesthesia type (None, local, MAC, general) ? SPINAL   Julaine Hua 04/18/2020, 3:57 PM  _________________________________________________________________   (provider comments below)

## 2020-04-19 NOTE — Telephone Encounter (Signed)
   Primary Cardiologist: Tonny Bollman, MD  Chart reviewed as part of pre-operative protocol coverage. Patient recently had a right total knee replacement and now he is scheduled to have a left total knee replacement. Patient was contacted today  further pre-op evaluation per protocol. He states he is doing well from a cardiac standpoint. His right knee surgery went well with no complications. He has been working with PT and is able to walk 3 miles with no problems. No chest pain or shortness of breath. Given past medical history and time since last visit, based on ACC/AHA guidelines, Ryan Ogborn would be at acceptable risk for the planned procedure without further cardiovascular testing.   OK to hold Aspirin for 5-7 day prior to procedure. This should be restarted as soon as able post-operatively.  I will route this recommendation to the requesting party via Epic fax function and remove from pre-op pool.  Please call with questions.  Corrin Parker, PA-C 04/19/2020, 10:13 AM

## 2020-05-10 ENCOUNTER — Other Ambulatory Visit: Payer: Self-pay | Admitting: *Deleted

## 2020-05-10 DIAGNOSIS — I712 Thoracic aortic aneurysm, without rupture, unspecified: Secondary | ICD-10-CM

## 2020-06-18 ENCOUNTER — Ambulatory Visit
Admission: RE | Admit: 2020-06-18 | Discharge: 2020-06-18 | Disposition: A | Discharge status: Home / Self Care | Payer: BC Managed Care – PPO | Source: Ambulatory Visit | Attending: Thoracic Surgery (Cardiothoracic Vascular Surgery) | Admitting: Thoracic Surgery (Cardiothoracic Vascular Surgery)

## 2020-06-18 DIAGNOSIS — I712 Thoracic aortic aneurysm, without rupture, unspecified: Secondary | ICD-10-CM

## 2020-06-18 MED ORDER — IOPAMIDOL (ISOVUE-370) INJECTION 76%
75.0000 mL | Freq: Once | INTRAVENOUS | Status: AC | PRN
  Administered 2020-06-18: 75 mL via INTRAVENOUS

## 2020-06-25 ENCOUNTER — Encounter: Payer: Self-pay | Admitting: Thoracic Surgery (Cardiothoracic Vascular Surgery)

## 2020-06-25 ENCOUNTER — Ambulatory Visit (INDEPENDENT_AMBULATORY_CARE_PROVIDER_SITE_OTHER): Payer: BC Managed Care – PPO | Admitting: Thoracic Surgery (Cardiothoracic Vascular Surgery)

## 2020-06-25 ENCOUNTER — Other Ambulatory Visit: Payer: Self-pay

## 2020-06-25 VITALS — BP 131/87 | HR 65 | Resp 20 | Ht 71.0 in | Wt 207.0 lb

## 2020-06-25 DIAGNOSIS — I712 Thoracic aortic aneurysm, without rupture, unspecified: Secondary | ICD-10-CM

## 2020-06-25 NOTE — Progress Notes (Signed)
[address]       [name] [address]             [phone]      HPI: 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  He was first found to have an ascending aneurysm in October 2018.  He was also noted to have hilar and mediastinal adenopathy.  Mediastinoscopy in 2019 showed sarcoidosis.  He did not require treatment and his lymph adenopathy resolved.  He has been followed for his aneurysm.  In the interim since his last visit he was diagnosed with fungoides.  That was treated with ivermectin.  He also recently had knee replacements.   He is feeling well.  He does not have any chest pain, pressure, tightness, or shortness of breath.  Past Medical History:  Diagnosis Date  . Cluster headaches    "q couple years" (07/15/2015)  . Depression    "mood stabilizer"  . Fibromyalgia   . Hypercholesterolemia   . Hypertension   . PFO with atrial septal aneurysm 07/15/2015   s/p 35 mm Amplatzer PFO device by Dr. Excell Seltzer 07/15/2015 // Echocardiogram 02/2019: EF 55-60, Gr 1 DD, mod LAE, mild TR, mild to mod aortic sclerosis, ascending aorta 38 mm, PFO closure device present without residual shunt  . Stroke (HCC) 11/2014; 05/2015   denies residual from either on 07/15/2015  . Thoracic aortic aneurysm (HCC)    CT 12/19: 4.3 cm    Current Outpatient Medications  Medication Sig Dispense Refill  . amLODipine (NORVASC) 2.5 MG tablet Take 5 mg by mouth daily.     Marland Kitchen aspirin EC 81 MG tablet Take 1 tablet (81 mg total) by mouth daily. 30 tablet 0  . atorvastatin (LIPITOR) 20 MG tablet Take 20 mg by mouth daily.   7  . PARoxetine (PAXIL) 10 MG tablet Take 10 mg by mouth daily.   0  . SUMAtriptan (IMITREX) 50 MG tablet Take 50 mg by mouth daily as needed.     . traZODone (DESYREL) 50 MG  tablet Take 50 mg by mouth at bedtime.    . triamterene-hydrochlorothiazide (MAXZIDE-25) 37.5-25 MG tablet Take 1 tablet by mouth daily.  0   No current facility-administered medications for this visit.    Physical Exam BP 131/87 (BP Location: Left Arm, Patient Position: Sitting)   Pulse 65   Resp 20   Ht 5\' 11"  (1.803 m)   Wt 207 lb (93.9 kg)   SpO2 96% Comment: RA with mask on  BMI 28.87 kg/m  Well-appearing 64 year old man in no acute distress Alert and oriented x3 with no focal deficits Lungs clear with equal breath sounds bilaterally Cardiac regular rate and rhythm with normal S1 and S2  Diagnostic Tests: CT ANGIOGRAPHY CHEST WITH CONTRAST  TECHNIQUE: Multidetector CT imaging of the chest was performed using the standard protocol during bolus administration of intravenous contrast. Multiplanar CT image reconstructions and MIPs were obtained to evaluate the vascular anatomy.  CONTRAST:  102mL ISOVUE-370 IOPAMIDOL (ISOVUE-370) INJECTION 76%  COMPARISON:  01/08/2020 and previous  FINDINGS: Cardiovascular: Heart size normal. No pericardial effusion. Stable ASD occluder device without associated thrombus. The RV is nondilated. Satisfactory opacification of pulmonary arteries noted, and there is no evidence of pulmonary emboli. Adequate contrast opacification of the thoracic aorta with no evidence of dissection, or stenosis.  There is bovine variant brachiocephalic arch anatomy without proximal stenosis. Diameters as follows:  Aortic Root:  --Valve: 3.2 cm  --Sinuses: 4.0 cm  --Sinotubular Junction: 3.2 cm  Limitations by motion: Moderate  Thoracic Aorta:  --Ascending Aorta: 4.3 cm (previously 4.4)  --Aortic Arch: 3.7 cm  --Descending Aorta: 3.1 cm  Visualized proximal abdominal aorta unremarkable.  Mediastinum/Nodes: No mass or adenopathy.  Lungs/Pleura: No pleural effusion. No pneumothorax. 0.4 cm nodule, inferior lingula (Im101,Se6) ,  stable. No new nodule or infiltrate.  Upper Abdomen: No acute findings  Musculoskeletal: Anterior vertebral endplate spurring at multiple levels in the mid thoracic spine. No fracture or worrisome bone lesion.  Review of the MIP images confirms the above findings.  IMPRESSION: 1. Stable 4.3 cm ascending thoracic aortic aneurysm without complicating features. Recommend annual imaging followup by CTA or MRA. This recommendation follows 2010 ACCF/AHA/AATS/ACR/ASA/SCA/SCAI/SIR/STS/SVM Guidelines for the Diagnosis and Management of Patients with Thoracic Aortic Disease. Circulation. 2010; 121: I144-R154 2. Stable 0.4 cm lingular nodule. Recommend attention on follow-up scans until stability x5 years confirmed.   Electronically Signed   By: Corlis Leak M.D.   On: 06/18/2020 12:34 I personally reviewed the CT images and concur with the findings noted above.  The lung nodule is stable from his last scan but significantly smaller compared to scans from a few years ago.  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  He recently was diagnosed with Strongyloides he is probably from working in [place].   Mediastinal adenopathy/lung nodules-history of sarcoidosis.  Adenopathy resolved.  Tiny nodule lingula the smaller than it was several years ago.  He has otherwise yet thank you  Ascending aneurysm-stable at around 4.3 cm.  Needs continued annual follow-up.  He is on Lipitor.  Hypertension-blood pressure well controlled on current regimen.  Plan: Return in 1 year with CT angio of chest  Loreli Slot, MD [facility] [phone]

## 2020-07-09 ENCOUNTER — Other Ambulatory Visit: Payer: Self-pay

## 2020-07-09 ENCOUNTER — Encounter: Payer: Self-pay | Admitting: Emergency Medicine

## 2020-07-09 ENCOUNTER — Ambulatory Visit (INDEPENDENT_AMBULATORY_CARE_PROVIDER_SITE_OTHER): Payer: BC Managed Care – PPO | Admitting: Emergency Medicine

## 2020-07-09 DIAGNOSIS — B789 Strongyloidiasis, unspecified: Secondary | ICD-10-CM

## 2020-07-09 DIAGNOSIS — J4520 Mild intermittent asthma, uncomplicated: Secondary | ICD-10-CM

## 2020-07-09 DIAGNOSIS — D869 Sarcoidosis, unspecified: Secondary | ICD-10-CM

## 2020-07-09 NOTE — Assessment & Plan Note (Signed)
Borderline bronchodilator response with normal FEV1 and FVC, consistent with probable asthma associated with sarcoid.  He is asymptomatic and does not have a bronchodilator.  We did review the typical symptoms associated with asthma.  He will watch for triggers, watch for symptoms.  If these evolve then we will start albuterol, possibly schedule medication.

## 2020-07-09 NOTE — Assessment & Plan Note (Signed)
There is crossover between Strongyloides and sarcoidosis in the literature.  He did not have any mediastinal nodal eosinophilia.  I believe we have to assume that this is quiescent sarcoidosis.  We should be able to follow for any changes in his parenchyma or mediastinal adenopathy when he has his CT chest in December to follow his TAA.

## 2020-07-09 NOTE — Patient Instructions (Signed)
Agree with repeat CT scan of the chest in December 2022. Please call our office if you develop any respiratory symptoms including shortness of breath, wheezing, coughing. Follow Dr. Delton Coombes in December after your CT scan to review together, sooner if you have any problems.

## 2020-07-09 NOTE — Progress Notes (Signed)
   Subjective:    Patient ID: Troy [name], male    DOB: 10-20-1955, 65 y.o.   MRN: [id number]  HPI   ROV 07/09/20 --65 year old gentleman, history of hypertension, hyperlipidemia, CVA with PFO repair.  Diagnosed with sarcoidosis by mediastinoscopy for mediastinal lymphadenopathy (no eosinophils).  Subsequently found to have peripheral eosinophilia, elevated IgE and positive Strongyloides antibody.  He was treated with ivermectin x2 and is presumably cured from Strongyloides. PFT 03/02/2018 reviewed, shows grossly normal airflows with a borderline bronchodilator response, normal lung volumes and normal diffusion capacity. He is not on any inhaled meds.  He showed me lab work today that confirms that his eosinophilia has resolved Reports that his breathing is overall stable - he is getting back to gym post-knee replacement. He does not wheeze. Does not have exertional limitation.   Repeat CT chest to follow TAA and his mediastinal adenopathy was done 06/18/2020, reviewed by me, shows stable ASD occluder device, nondilated RV, no PE, stable ascending aorta 4.3 cm.  His mediastinal adenopathy has resolved.  There is a stable 4 mm inferior lingular nodule  MDM:  Reviewed thoracic surgery note from 06/25/2020   Review of Systems As above      Objective:   Physical Exam Vitals:   07/09/20 1351  BP: 120/82  Pulse: 73  Temp: (!) 97 F (36.1 C)  SpO2: 98%  Weight: 203 lb 12.8 oz (92.4 kg)  Height: 5\' 11"  (1.803 m)    Gen: Pleasant, well-nourished, in no distress,  normal affect, no coughing or throat clearing  ENT: No lesions,  mouth clear,  oropharynx clear, no postnasal drip  Neck: No JVD, no stridor  Lungs: No use of accessory muscles, no wheezing today  Cardiovascular: RRR, heart sounds normal, no murmur or gallops, no peripheral edema  Musculoskeletal: No deformities, no cyanosis or clubbing  Neuro: alert, awake, non focal  Skin: Warm, no lesions or rash        Assessment  & Plan:  Strongyloidiasis Appears to have been adequately treated.  His peripheral eosinophilia has resolved  Sarcoidosis There is crossover between Strongyloides and sarcoidosis in the literature.  He did not have any mediastinal nodal eosinophilia.  I believe we have to assume that this is quiescent sarcoidosis.  We should be able to follow for any changes in his parenchyma or mediastinal adenopathy when he has his CT chest in December to follow his TAA.   Asthma Borderline bronchodilator response with normal FEV1 and FVC, consistent with probable asthma associated with sarcoid.  He is asymptomatic and does not have a bronchodilator.  We did review the typical symptoms associated with asthma.  He will watch for triggers, watch for symptoms.  If these evolve then we will start albuterol, possibly schedule medication.  January, MD, PhD 07/09/2020, 2:08 PM [facility] [phone] or if no answer [phone]

## 2020-07-09 NOTE — Addendum Note (Signed)
Addended by: Dorisann Frames R on: 07/09/2020 02:43 PM   Modules accepted: Orders

## 2020-07-09 NOTE — Assessment & Plan Note (Signed)
Appears to have been adequately treated.  His peripheral eosinophilia has resolved

## 2020-11-18 ENCOUNTER — Other Ambulatory Visit (HOSPITAL_BASED_OUTPATIENT_CLINIC_OR_DEPARTMENT_OTHER): Payer: Self-pay

## 2020-11-18 MED ORDER — TYPHOID VACCINE PO CPDR
DELAYED_RELEASE_CAPSULE | ORAL | 0 refills | Status: DC
  Filled 2020-11-18: qty 4, 8d supply, fill #0

## 2021-05-09 ENCOUNTER — Other Ambulatory Visit: Payer: Self-pay | Admitting: *Deleted

## 2021-05-09 DIAGNOSIS — I7121 Aneurysm of the ascending aorta, without rupture: Secondary | ICD-10-CM

## 2021-06-02 ENCOUNTER — Other Ambulatory Visit: Payer: Self-pay

## 2021-06-05 DIAGNOSIS — M545 Low back pain, unspecified: Secondary | ICD-10-CM | POA: Diagnosis not present

## 2021-06-09 DIAGNOSIS — M545 Low back pain, unspecified: Secondary | ICD-10-CM | POA: Diagnosis not present

## 2021-06-10 ENCOUNTER — Inpatient Hospital Stay: Admission: RE | Admit: 2021-06-10 | Payer: Self-pay | Source: Ambulatory Visit

## 2021-06-11 DIAGNOSIS — M79642 Pain in left hand: Secondary | ICD-10-CM | POA: Diagnosis not present

## 2021-06-12 DIAGNOSIS — M545 Low back pain, unspecified: Secondary | ICD-10-CM | POA: Diagnosis not present

## 2021-06-13 ENCOUNTER — Encounter: Payer: Self-pay | Admitting: Cardiovascular Disease

## 2021-06-13 NOTE — Telephone Encounter (Signed)
Please cancel our Ct scan - he only needs the one ordered by Dr Dorris Fetch   Message routed to [facility]

## 2021-06-13 NOTE — Telephone Encounter (Signed)
Please cancel our Ct scan - he only needs the one ordered by Dr Dorris Fetch

## 2021-06-13 NOTE — Telephone Encounter (Signed)
He is correct - he only needs one scan. We can cancel ours and hebe him get the one from Dr Dorris Fetch

## 2021-06-16 DIAGNOSIS — M25642 Stiffness of left hand, not elsewhere classified: Secondary | ICD-10-CM | POA: Diagnosis not present

## 2021-06-16 NOTE — Telephone Encounter (Signed)
I have cancelled CT and LBCT that was scheduled for 12/23.  Will route back to triage to close message.

## 2021-06-17 DIAGNOSIS — M545 Low back pain, unspecified: Secondary | ICD-10-CM | POA: Diagnosis not present

## 2021-06-20 ENCOUNTER — Other Ambulatory Visit: Payer: Self-pay

## 2021-06-25 ENCOUNTER — Other Ambulatory Visit: Payer: Self-pay

## 2021-06-25 ENCOUNTER — Telehealth (INDEPENDENT_AMBULATORY_CARE_PROVIDER_SITE_OTHER): Payer: Medicare PPO | Admitting: Internal Medicine

## 2021-06-25 ENCOUNTER — Encounter: Payer: Self-pay | Admitting: Internal Medicine

## 2021-06-25 DIAGNOSIS — D7219 Other eosinophilia: Secondary | ICD-10-CM | POA: Diagnosis not present

## 2021-06-25 NOTE — Assessment & Plan Note (Signed)
Recent travel, previously with Strongyloides and treated.  Ab will remain positive so will check eosinophils and if elevated, consider retreatment with ivermectin.

## 2021-06-25 NOTE — Progress Notes (Signed)
° °  Subjective:    Patient ID: Troy [name], male    DOB: 19-Jan-1956, 65 y.o.   MRN: [id number]  I connected with  Troy [name] on 06/25/21 by a video enabled telemedicine application and verified that I am speaking with the correct person using two identifiers.   I discussed the limitations of evaluation and management by telemedicine. The patient expressed understanding and agreed to proceed.  Location: Patient - home Physician - clinic  Duration of visit:  12 minutes  HPI He is following up post travel to [place].  He returned in August from [place].  He is having no diarrhea, rash/hives, no vision changes.  He did note a swollen index finger and is getting OT for it.  Asking if it could be related to his travel.  No fever, no weight loss.    Review of Systems  Constitutional:  Negative for fatigue.  Gastrointestinal:  Negative for diarrhea and nausea.  Skin:  Negative for rash.      Objective:   Physical Exam Eyes:     General: No scleral icterus. Pulmonary:     Effort: Pulmonary effort is normal.  Neurological:     Mental Status: He is alert.          Assessment & Plan:

## 2021-07-02 DIAGNOSIS — M545 Low back pain, unspecified: Secondary | ICD-10-CM | POA: Diagnosis not present

## 2021-07-04 ENCOUNTER — Ambulatory Visit
Admission: RE | Admit: 2021-07-04 | Discharge: 2021-07-04 | Disposition: A | Discharge status: Home / Self Care | Payer: Medicare PPO | Source: Ambulatory Visit | Attending: Thoracic Surgery (Cardiothoracic Vascular Surgery) | Admitting: Thoracic Surgery (Cardiothoracic Vascular Surgery)

## 2021-07-04 ENCOUNTER — Other Ambulatory Visit: Payer: Self-pay

## 2021-07-04 ENCOUNTER — Other Ambulatory Visit: Payer: Medicare PPO

## 2021-07-04 DIAGNOSIS — D7219 Other eosinophilia: Secondary | ICD-10-CM | POA: Diagnosis not present

## 2021-07-04 DIAGNOSIS — I7121 Aneurysm of the ascending aorta, without rupture: Secondary | ICD-10-CM

## 2021-07-04 DIAGNOSIS — I712 Thoracic aortic aneurysm, without rupture, unspecified: Secondary | ICD-10-CM | POA: Diagnosis not present

## 2021-07-04 LAB — CBC WITH DIFFERENTIAL/PLATELET
Absolute Monocytes: 551 cells/uL (ref 200–950)
Basophils Absolute: 59 cells/uL (ref 0–200)
Basophils Relative: 1.1 %
Eosinophils Absolute: 167 cells/uL (ref 15–500)
Eosinophils Relative: 3.1 %
HCT: 42.7 % (ref 38.5–50.0)
Hemoglobin: 14.3 g/dL (ref 13.2–17.1)
Lymphs Abs: 1561 cells/uL (ref 850–3900)
MCH: 28.5 pg (ref 27.0–33.0)
MCHC: 33.5 g/dL (ref 32.0–36.0)
MCV: 85.2 fL (ref 80.0–100.0)
MPV: 10.4 fL (ref 7.5–12.5)
Monocytes Relative: 10.2 %
Neutro Abs: 3062 cells/uL (ref 1500–7800)
Neutrophils Relative %: 56.7 %
Platelets: 220 10*3/uL (ref 140–400)
RBC: 5.01 10*6/uL (ref 4.20–5.80)
RDW: 12.4 % (ref 11.0–15.0)
Total Lymphocyte: 28.9 %
WBC: 5.4 10*3/uL (ref 3.8–10.8)

## 2021-07-04 MED ORDER — IOPAMIDOL (ISOVUE-370) INJECTION 76%
75.0000 mL | Freq: Once | INTRAVENOUS | Status: AC | PRN
  Administered 2021-07-04: 75 mL via INTRAVENOUS

## 2021-07-05 ENCOUNTER — Encounter: Payer: Self-pay | Admitting: Internal Medicine

## 2021-07-07 DIAGNOSIS — M25642 Stiffness of left hand, not elsewhere classified: Secondary | ICD-10-CM | POA: Diagnosis not present

## 2021-07-10 ENCOUNTER — Ambulatory Visit: Payer: Medicare PPO | Admitting: Physician Assistant

## 2021-07-10 ENCOUNTER — Other Ambulatory Visit: Payer: Self-pay

## 2021-07-10 VITALS — BP 124/76 | HR 72 | Resp 20 | Ht 71.0 in | Wt 203.0 lb

## 2021-07-10 DIAGNOSIS — I7121 Aneurysm of the ascending aorta, without rupture: Secondary | ICD-10-CM

## 2021-07-10 NOTE — Patient Instructions (Signed)
No change in medications.  Maintain blood pressure control.  Follow up 1 year with CTA chest

## 2021-07-10 NOTE — Progress Notes (Signed)
[address]       [name] [address]             [phone]       HPI: Mr. Wenke returns today for scheduled surveillance of his ascending aortic he has been.  Mr. Fearn is a pleasant 66 year old gentleman with a history of PFO closure with an Amplatzer device, stroke, hypertension, dyslipidemia, and mediastinal adenopathy secondary to scoliosis.  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  He has a history of sarcoidosis in 2019 with mediastinal and hilar adenopathy that has since resolved.  He has been followed by pulmonary medicine for this.    He was incidentally discovered to have an ascending aortic aneurysm.  He has been followed by our office for surveillance.  He reports no changes in his health since his visit last year.  He feels well.  Denies any chest pain, pressure, or shortness of breath.    Current Outpatient Medications  Medication Sig Dispense Refill   amLODipine (NORVASC) 2.5 MG tablet Take 5 mg by mouth daily.      aspirin EC 81 MG tablet Take 1 tablet (81 mg total) by mouth daily. 30 tablet 0   atorvastatin (LIPITOR) 20 MG tablet Take 20 mg by mouth daily.   7   traZODone (DESYREL) 50 MG tablet Take 50 mg by mouth at bedtime.     triamterene-hydrochlorothiazide (MAXZIDE-25) 37.5-25 MG tablet Take 1 tablet by mouth daily.  0   PARoxetine (PAXIL) 10 MG tablet Take 10 mg by mouth daily.  0   SUMAtriptan (IMITREX) 50 MG tablet Take 50 mg by mouth daily as needed.     No current facility-administered medications for this visit.    Physical Exam:  Vital signs: Blood pressure 124/76 Pulse 72 Respirations 20 SPO2 96% on room air  General: Well-appearing 66 year old male.  No acute distress. Heart: Regular rate and rhythm without murmur Chest: Breath sounds are clear to auscultation. Extremities: Warm well perfused, no peripheral  edema.  Diagnostic Tests:  CLINICAL DATA:  Thoracic aortic aneurysm.   EXAM: CT ANGIOGRAPHY CHEST WITH CONTRAST   TECHNIQUE: Multidetector CT imaging of the chest was performed using the standard protocol during bolus administration of intravenous contrast. Multiplanar CT image reconstructions and MIPs were obtained to evaluate the vascular anatomy.   CONTRAST:  66mL ISOVUE-370 IOPAMIDOL (ISOVUE-370) INJECTION 76%   COMPARISON:  June 18, 2020.   FINDINGS: Cardiovascular: Grossly stable 4.4 cm ascending thoracic aortic aneurysm is noted. No dissection is noted. Great vessels are widely patent without stenosis. Normal cardiac size. No pericardial effusion. Interatrial septal closure device is noted.   Mediastinum/Nodes: No enlarged mediastinal, hilar, or axillary lymph nodes. Thyroid gland, trachea, and esophagus demonstrate no significant findings.   Lungs/Pleura: No pneumothorax or pleural effusion is noted. Stable 4 mm left lingular nodule is noted.   Upper Abdomen: No acute abnormality.   Musculoskeletal: No chest wall abnormality. No acute or significant osseous findings.   Review of the MIP images confirms the above findings.   IMPRESSION: Grossly stable 4.4 cm ascending thoracic aortic aneurysm. Recommend annual imaging followup by CTA or MRA. This recommendation follows 2010 ACCF/AHA/AATS/ACR/ASA/SCA/SCAI/SIR/STS/SVM Guidelines for the Diagnosis and Management of Patients with Thoracic Aortic Disease. Circulation. 2010; 121: K998-P382. Aortic aneurysm NOS (ICD10-I71.9).   Stable 4 mm nodule seen in left lingula. Attention to this on follow-up imaging is recommended.  Electronically Signed   By: Lupita Raider M.D.   On: 07/04/2021 12:53  Impression / Plan: Stable ascending aortic aneurysm measuring 4.4 cm.  The hilar and mediastinal adenopathy is resolved.  Imaging revealed 4 mm nodule in the left lingula that should be followed.  An  echocardiogram obtained in 2020 showed normal structure and function of the aortic valve.  We will plan for repeat CT angio of the chest in 1 year.  Mr. Riches mentioned he will be following up with his pulmonologist in the near future.   Leary Roca, PA-C [facility] [phone]

## 2021-07-17 DIAGNOSIS — G44019 Episodic cluster headache, not intractable: Secondary | ICD-10-CM | POA: Diagnosis not present

## 2021-07-23 ENCOUNTER — Ambulatory Visit: Payer: Medicare PPO | Admitting: Neurology

## 2021-07-23 ENCOUNTER — Other Ambulatory Visit: Payer: Self-pay

## 2021-07-23 ENCOUNTER — Encounter: Payer: Self-pay | Admitting: Neurology

## 2021-07-23 VITALS — BP 122/81 | HR 62 | Ht 72.0 in | Wt 190.0 lb

## 2021-07-23 DIAGNOSIS — G44019 Episodic cluster headache, not intractable: Secondary | ICD-10-CM

## 2021-07-23 MED ORDER — SUMATRIPTAN SUCCINATE 100 MG PO TABS: 100.0000 mg | ORAL_TABLET | Freq: Once | ORAL | 3 refills | Status: DC | PRN

## 2021-07-23 NOTE — Progress Notes (Signed)
[facility]    Provider:  Dr Troy [name] Requesting Provider: Lujean Amel, MD Primary Care Provider:  Lujean Amel, MD  CC:  cluster headaches  HPI:  Troy [name] is a 66 y.o. male here as requested by Troy Amel, MD for cluster headaches. PMHx cluster headaches, depression, fibromyalgia, hypertension, PFO with atrial septal aneurysm July 15, 2015 involved with stroke, thoracic aortic aneurysm.  I reviewed my prior notes from 2018, he has episodic cluster headaches, saw me years ago in 2018, at that time we discussed acute and chronic management of medications, and he decided not to start verapamil and just use acute, we gave him Imitrex injectable and started steroids.  I reviewed charts and MRI images below.  Today he is back again for episodic cluster headaches, I reviewed Dr. Dorthy [name]  notes: History of cluster headaches, goes back many years, last episode was a few years ago, he had improved since he had his PFO thought it might of been some relation to it, but for for the last 3 to 4 days he has been getting pain, left-sided, orbital with some tearing no vision issues no nausea vomiting, they prescribed Zembrace but he never picked it up due to prior Auth, he is to use ergot's.  He was given oral Imitrex.  Medications tried that can be used in headache or episodic cluster headache management: Norvasc, verapamil, aspirin, Tylenol, Imitrex injectable, Imitrex oral, prednisone injections and high-dose prednisone oral, Valium, Voltaren, lidocaine injection, lorazepam, methylprednisolone oral, Zofran, oxycodone, Paxil, Phenergan, oxygen, trazodone, gabapentin.  Lithium contraindicated due to mood disorder that is not bipolar and already on trazodone; lithium has not been proven effective for depression unless in the setting of bipolar and patient has depression.  I would prefer to stay away from lithium since he is being treated with trazodone.  We had an extended visit  today, patient thought when he had his PFO fixed maybe the clusters had gone away but he has been in the throes of an episode the last 10 days, I have not seen him since 2018, he returns today with horrible cluster headaches, they usually last about a month, they are severe, stabbing excruciating orbital or periorbital pain, with autonomic symptoms, his father had cluster headaches as well, he has had them for years, we have talked about cluster headaches in the past, foods that may trigger, however he does not seem to see a pattern with foods but he is staying away from alcohol.  Since his episodes come every several years, in the past he has declined verapamil but he got a prescription from his primary care and had side effects.  At this point we talked about what we do.  We gave him oxygen in the past but by the time we get that his episode will almost be over.  We talked about steroids, treatment, he is episodes can last upwards of 3 hours, likely Imitrex tabs help, some brace was too expensive for him even when his primary care had the PA approved, nothing seems to trigger it unknown.  Severe.No other focal neurologic deficits, associated symptoms, inciting events or modifiable factors.     Reviewed notes, labs and imaging from outside physicians, which showed:  MRI brain 2016: FINDINGS: reviewed report Cerebral volume is within normal limits for age. Major intracranial vascular flow voids are preserved. There is a mild degree of intracranial artery dolichoectasia. Subtle focus of abnormal trace diffusion in the right motor strip series 3, image 38. This is evident  on the ADC map (image 38). There is also a small linear right parietal lobe subcortical white matter focus of restricted diffusion (series 3, image 33). Tiny focus also seen in the right caudate nucleus on image 30. There also appears to be a right lateral occipital lobe cortical focus along the posterior margin of the right MCA  territory (image 24).   No left hemisphere or posterior fossa restricted diffusion.   No acute intracranial hemorrhage identified. No midline shift, mass effect, or evidence of intracranial mass lesion. Mild if any associated T2 hyperintensity at the abnormal diffusion sites.   Chronic micro hemorrhage in the inferior left cerebellum. There is a small focus of left MCA cortical encephalomalacia on series 7, image 23.   Negative visualized cervical spine. No ventriculomegaly. Normal bone marrow signal. Visible internal auditory structures appear normal. Mastoids are clear. Trace paranasal sinus mucosal thickening. Orbit and scalp soft tissues within normal limits.   IMPRESSION: 1. Scattered small/punctate acute infarcts in the right MCA territory, including the right motor strip near the left upper extremity representation, right caudate. 2. No hemorrhage or mass effect. 3. Small chronic cortical infarct in the left MCA territory. Small chronic micro hemorrhage in the left cerebellum.  Review of Systems: Patient complains of symptoms per HPI as well as the following symptoms episodic cluster headaches. Pertinent negatives and positives per HPI. All others negative.   Social History   Socioeconomic History   Marital status: Married    Spouse name: Troy [name]   Number of children: 1   Years of education: PhD   Highest education level: Not on file  Occupational History   Occupation: [employer]  Tobacco Use   Smoking status: Never   Smokeless tobacco: Never  Vaping Use   Vaping Use: Never used  Substance and Sexual Activity   Alcohol use: Yes    Alcohol/week: 5.0 standard drinks    Types: 5 Cans of beer per week   Drug use: No   Sexual activity: Not Currently  Other Topics Concern   Not on file  Social History Narrative   Lives with wife Troy [name] from [place]. Moved about 5 years ago   Caffeine use: daily   Social Determinants of Health   Financial Resource Strain:  Not on file  Food Insecurity: Not on file  Transportation Needs: Not on file  Physical Activity: Not on file  Stress: Not on file  Social Connections: Not on file  Intimate Partner Violence: Not on file    Family History  Problem Relation Age of Onset   Headache Father     Past Medical History:  Diagnosis Date   Cluster headaches    "q couple years" (07/15/2015)   Depression    "mood stabilizer"   Fibromyalgia    Hypercholesterolemia    Hypertension    PFO with atrial septal aneurysm 07/15/2015   s/p 35 mm Amplatzer PFO device by Dr. Burt Knack 07/15/2015 // Echocardiogram 02/2019: EF 55-60, Gr 1 DD, mod LAE, mild TR, mild to mod aortic sclerosis, ascending aorta 38 mm, PFO closure device present without residual shunt   Stroke ([place]) 11/2014; 05/2015   denies residual from either on 07/15/2015   Thoracic aortic aneurysm    CT 12/19: 4.3 cm    Patient Active Problem List   Diagnosis Date Noted   Strongyloidiasis 11/02/2019   Asthma 09/15/2019   Eosinophilia 08/10/2019   Sarcoidosis 07/12/2019   Chronic cough 07/12/2019   Thoracic aortic aneurysm  Mediastinal adenopathy 11/11/2017   Cluster headache 03/11/2016   PFO with atrial septal aneurysm 07/15/2015   Acute CVA (cerebrovascular accident) (HCC) 06/09/2015   TIA (transient ischemic attack) 06/08/2015   Stroke (cerebrum) (HCC) 06/08/2015   Aphasia 06/08/2015   Left arm numbness 06/08/2015   Hypertension     Past Surgical History:  Procedure Laterality Date   CARDIAC CATHETERIZATION N/A 07/15/2015   Procedure: PFO Closure;  Surgeon: Tonny Bollman, MD;  Location: [facility] INVASIVE CV LAB;  Service: Cardiovascular;  Laterality: N/A;   KNEE ARTHROSCOPY Right 2013   "meniscus repair"   MEDIASTINOSCOPY N/A 11/15/2017   Procedure: MEDIASTINOSCOPY;  Surgeon: Loreli Slot, MD;  Location: [facility] OR;  Service: Thoracic;  Laterality: N/A;   PATENT FORAMEN OVALE CLOSURE  07/15/2015   SHOULDER OPEN ROTATOR CUFF REPAIR Right 1990     Current Outpatient Medications  Medication Sig Dispense Refill   amLODipine (NORVASC) 2.5 MG tablet Take 5 mg by mouth daily.      aspirin EC 81 MG tablet Take 1 tablet (81 mg total) by mouth daily. 30 tablet 0   atorvastatin (LIPITOR) 20 MG tablet Take 20 mg by mouth daily.   7   Galcanezumab-gnlm (EMGALITY, 300 MG DOSE,) 100 MG/ML SOSY Inject 300 mg into the skin every 30 (thirty) days. During an episodic cluster flare. 3.08 mL 3   SUMAtriptan (IMITREX) 100 MG tablet Take 1 tablet (100 mg total) by mouth once as needed for up to 1 dose. May repeat in 2 hours if headache persists or recurs. Max 2x a day. 27 tablet 3   traZODone (DESYREL) 50 MG tablet Take 50 mg by mouth at bedtime.     triamterene-hydrochlorothiazide (MAXZIDE-25) 37.5-25 MG tablet Take 1 tablet by mouth daily.  0   No current facility-administered medications for this visit.    Allergies as of 07/23/2021   (No Known Allergies)    Vitals: BP 122/81    Pulse 62    Ht 6' (1.829 m)    Wt 190 lb (86.2 kg)    BMI 25.77 kg/m  Last Weight:  Wt Readings from Last 1 Encounters:  07/23/21 190 lb (86.2 kg)   Last Height:   Ht Readings from Last 1 Encounters:  07/23/21 6' (1.829 m)     Physical exam: Exam: Gen: NAD, conversant, well nourised, well groomed                     CV: RRR, no MRG. No Carotid Bruits. No peripheral edema, warm, nontender Eyes: Conjunctivae clear without exudates or hemorrhage  Neuro: Detailed Neurologic Exam  Speech:    Speech is normal; fluent and spontaneous with normal comprehension.  Cognition:    The patient is oriented to person, place, and time;     recent and remote memory intact;     language fluent;     normal attention, concentration,     fund of knowledge Cranial Nerves:    The pupils are equal, round, and reactive to light. The fundi are flat. Visual fields are full to finger confrontation. Extraocular movements are intact. Trigeminal sensation is intact and the muscles  of mastication are normal. The face is symmetric. The palate elevates in the midline. Hearing intact. Voice is normal. Shoulder shrug is normal. The tongue has normal motion without fasciculations.   Coordination:    Normal   Gait:     normal.   Motor Observation:    No asymmetry, no atrophy, and no involuntary  movements noted. Tone:    Normal muscle tone.    Posture:    Posture is normal. normal erect    Strength:    Strength is V/V in the upper and lower limbs.      Sensation: intact to LT     Reflex Exam:  DTR's:    Deep tendon reflexes in the upper and lower extremities are equiv bilaterally.   Toes:    The toes are equiv  bilaterally.   Clonus:    Clonus is absent.    Assessment/Plan:   66 y.o. male here as requested by Troy Amel, MD for episodic cluster headaches. PMHx cluster headaches, depression, fibromyalgia, hypertension, PFO with atrial septal aneurysm July 15, 2015 involved with stroke, thoracic aortic aneurysm.  I reviewed my prior notes from 2018, he has episodic cluster headaches, saw me years ago in 2018, at that time we discussed acute and chronic management of medications, and he decided not to start verapamil and just use acute, we gave him Imitrex injectable and started steroids.patient thought when he had his PFO fixed maybe the clusters had gone away but he has been in the throes of an episode the last 10 days, he returns today with horrible cluster headaches.  -We will try to get Emgality for cluster headaches improved.  Today I gave him samples.  In the future at the onset of his headaches we can give him Imitrex twice a day for 2 weeks and then as needed, and restart him on Emgality 300 mg a month for 1 to 2 months or until his cluster headaches stop which is usually a month.  - we have talked about cluster headaches again today, foods that may trigger, however he does not seem to see a pattern with foods but he is staying away from alcohol.  Since  his episodes come every several years, in the past he has declined verapamil but he got a prescription from his primary care and had side effects.     - We gave him oxygen in the past but by the time we get that his episode will almost be over.  Imitrex tabs help.  He has tried Imitrex injectable as well in the past.  Side effects to verapamil.  In cases of acute cluster episode often times we have to start with extremely high dose steroids 100 mg and titrate over 2 weeks to get patient immediate relief.  Likely we do not have to do that today, Imitrex tabs help him.  Usually we do not recommend taking them more than 9 times a month but I did explain the risks of stroke and agreed to give him enough for twice a day since his clusters may only last a month.  We also injected Emgality 3 injections, and I gave him another 1 month worth of Emgality.  Meds ordered this encounter  Medications   SUMAtriptan (IMITREX) 100 MG tablet    Sig: Take 1 tablet (100 mg total) by mouth once as needed for up to 1 dose. May repeat in 2 hours if headache persists or recurs. Max 2x a day.    Dispense:  27 tablet    Refill:  3   Galcanezumab-gnlm (EMGALITY, 300 MG DOSE,) 100 MG/ML SOSY    Sig: Inject 300 mg into the skin every 30 (thirty) days. During an episodic cluster flare.    Dispense:  3.08 mL    Refill:  3    Cc: Koirala, Dibas, MD,  Koirala, Dibas,  MD  Sarina Ill, MD  [facility] [address] [place],  [address]  Phone [phone] Fax [phone]

## 2021-07-23 NOTE — Patient Instructions (Addendum)
Emgality: 3 injections monthly.  As needed imitrex: Please take one tablet at the onset of your headache. If it does not improve the symptoms please take one additional tablet. Do not take more then 2 tablets in 24hrs.   Cluster Headache A cluster headache is a type of primary headache that causes deep, intense head pain. Cluster headaches can last from 15 minutes to 3 hours. They usually occur on one side of the head or face. They may occur on the other side of the head when a new cluster of headaches begins. Cluster headaches occur repeatedly over weeks to months. They may happen several times a day. They often occur at the same time of day, often at night. They may happen more often in the fall and springtime. What are the causes? The cause of this condition is not known. Unlike migraine and tension headache, a cluster headache generally is not associated with triggers, such as foods, hormonal changes, or stress. What increases the risk? The following factors may make you likely to develop this condition: Being a male between the ages of 72-80 years old. Smoking or using products that contain nicotine or tobacco. Having elevated levels of histamine. This can happen in people who have allergies. Taking medicines that cause blood vessels to expand, such as nitroglycerin. Having a parent or sibling who has cluster headaches. What are the signs or symptoms? Symptoms of this condition include: Extremely bad pain on one side of the head that begins behind or around your eye or temple but may radiate to other areas of your face, head, and neck. Nausea. Sensitivity to light. Runny nose and nasal stuffiness. Forehead or facial sweating on the affected side. Droopy or swollen eyelid, eye redness, or tearing on the affected side. Restlessness and agitation. Pale skin (pallor) or flushing on your face. How is this diagnosed? This condition may be diagnosed based on: Your description of the attacks,  including your pain, the location and severity of your headaches, and symptoms. Your health care provider may also ask how often your headaches occur and how long they last. A neurological examination to detect physical signs of a neurological disorder. Your health care provider will test your senses, reflexes, and nerves. The exam is usually normal in people who have cluster headaches. Tests. Your health care provider may order additional tests to see if your headaches are caused by another medical condition. These tests may show that you do not have cluster headaches. Tests may include: MRI. CT scan. Blood tests. How is this treated? This condition may be treated with: Medicines to relieve pain and to prevent repeated attacks. Some people may need a combination of medicines. Oxygen that is breathed in through a mask. This helps to relieve pain of an attack in 15-20 minutes. Follow these instructions at home: Headache diary Keep a headache diary as told by your health care provider. Doing this can help you and your health care provider figure out what triggers your headaches. In your headache diary, include information about: The time of day that your headache started and what you were doing when it began. How long your headache lasted. Where your pain started and whether it moved to other areas. The type of pain, such as burning, stabbing, throbbing, or cramping. Your level of pain. Use a pain scale and rate the pain with a number from 1 (mild) up to 10 (severe). The treatment that you used, and any change in symptoms after treatment.  Medicines Take over-the-counter  and prescription medicines only as told by your health care provider. Ask your health care provider if the medicine prescribed to you: Requires you to avoid driving or using heavy machinery. Can cause constipation. You may need to take these actions to prevent or treat constipation: Drink enough fluid to keep your urine pale  yellow. Take over-the-counter or prescription medicines. Eat foods that are high in fiber, such as beans, whole grains, and fresh fruits and vegetables. Limit foods that are high in fat and processed sugars, such as fried or sweet foods. Lifestyle  Follow a regular sleep schedule. Do not vary the time that you go to bed or the amount that you sleep from day to day. It is important to stay on the same schedule during a cluster period to help prevent headaches. Get 7-9 hours of sleep each night, or the amount recommended by your health care provider. Limit or manage stress. Consider stress relief options such as acupuncture, counseling, biofeedback, and massage. Talk with your health care provider about which methods might be good for you. Exercise regularly. Exercise for at least 30 minutes, 5 times each week. Moderate exercise may be best. Eat a healthy diet and avoid any specific foods that may trigger your headaches. Do not drink alcohol. Drinking alcohol may quickly trigger a severe headache. Do not use any products that contain nicotine or tobacco, such as cigarettes, e-cigarettes, and chewing tobacco. If you need help quitting, ask your health care provider. General instructions Use oxygen as told by your health care provider. Keep all follow-up visits as told by your health care provider. This is important. Contact a health care provider if: Your headaches change, become more severe, or occur more often. The medicine or oxygen that your health care provider recommended does not help. Get help right away if you: Faint. Have weakness or numbness, especially on one side of your body or face. Have double vision. Have nausea or vomiting that does not go away within several hours. Have trouble talking, walking, or keeping your balance. Have pain or stiffness in your neck and you have a fever. Summary A cluster headache is a type of primary headache that causes deep, intense head pain,  usually on one side of the head. Keep a headache diary to help discover what triggers your headaches. Avoiding alcohol and certain medications may help prevent cluster headaches. There are many treatments for cluster headaches including oxygen, medications to stop a headache, and medications to prevent the headaches. Talk to your doctor about treatment options. This information is not intended to replace advice given to you by your health care provider. Make sure you discuss any questions you have with your health care provider. Document Revised: 07/20/2019 Document Reviewed: 07/20/2019 Elsevier Patient Education  [place].   Sumatriptan Injection What is this medication? SUMATRIPTAN (soo ma TRIP tan) treats migraines and cluster headaches. It works by blocking pain signals and narrowing blood vessels in the brain. It belongs to a group of medications called triptans. It is not used to prevent headaches or migraines. This medicine may be used for other purposes; ask your health care provider or pharmacist if you have questions. COMMON BRAND NAME(S): Alsuma, Imitrex, Imitrex STAT dose, Sumavel DosePro System, ZEMBRACE What should I tell my care team before I take this medication? They need to know if you have any of these conditions: Cigarette smoker Circulation problems in fingers and toes Diabetes Heart disease High blood pressure High cholesterol History of irregular heartbeat History  of stroke Kidney disease Liver disease Stomach or intestine problems An unusual or allergic reaction to sumatriptan, latex, other medications, foods, dyes, or preservatives Pregnant or trying to get pregnant Breast-feeding How should I use this medication? This medication is for injection under the skin. You will be taught how to prepare and give this medication. Use exactly as directed. Do not take your medication more often than directed. Talk to your care team regarding the use of this  medication in children. Special care may be needed. Overdosage: If you think you have taken too much of this medicine contact a poison control center or emergency room at once. NOTE: This medicine is only for you. Do not share this medicine with others. What if I miss a dose? This does not apply. This medication is not for regular use. What may interact with this medication? Do not take this medication with any of the following: Certain medications for migraine headache like almotriptan, eletriptan, frovatriptan, naratriptan, rizatriptan, sumatriptan, zolmitriptan Ergot alkaloids like dihydroergotamine, ergonovine, ergotamine, methylergonovine MAOIs like Carbex, Eldepryl, Marplan, Nardil, and Parnate This medication may also interact with the following: Certain medications for depression, anxiety, or psychotic disorders This list may not describe all possible interactions. Give your health care provider a list of all the medicines, herbs, non-prescription drugs, or dietary supplements you use. Also tell them if you smoke, drink alcohol, or use illegal drugs. Some items may interact with your medicine. What should I watch for while using this medication? Visit your care team for regular checks on your progress. Tell your care team if your symptoms do not start to get better or if they get worse. You may get drowsy or dizzy. Do not drive, use machinery, or do anything that needs mental alertness until you know how this medication affects you. Do not stand up or sit up quickly, especially if you are an older patient. This reduces the risk of dizzy or fainting spells. Alcohol may interfere with the effect of this medication. Tell your care team right away if you have any change in your eyesight. If you take migraine medications for 10 or more days a month, your migraines may get worse. Keep a diary of headache days and medication use. Contact your care team if your migraine attacks occur more  frequently. What side effects may I notice from receiving this medication? Side effects that you should report to your care team as soon as possible: Allergic reactions--skin rash, itching, hives, swelling of the face, lips, tongue, or throat Burning, pain, tingling, or color changes in the legs or feet Heart attack--pain or tightness in the chest, shoulders, arms, or jaw, nausea, shortness of breath, cold or clammy skin, feeling faint or lightheaded Heart rhythm changes--fast or irregular heartbeat, dizziness, feeling faint or lightheaded, chest pain, trouble breathing Increase in blood pressure Raynaud's--cool, numb, or painful fingers or toes that may change color from pale, to blue, to red Seizures Serotonin syndrome--irritability, confusion, fast or irregular heartbeat, muscle stiffness, twitching muscles, sweating, high fever, seizure, chills, vomiting, diarrhea Stroke--sudden numbness or weakness of the face, arm, or leg, trouble speaking, confusion, trouble walking, loss of balance or coordination, dizziness, severe headache, change in vision Sudden or severe stomach pain, nausea, vomiting, fever, or bloody diarrhea Vision loss Side effects that usually do not require medical attention (report to your care team if they continue or are bothersome): Dizziness Facial flushing, redness General discomfort or fatigue This list may not describe all possible side effects. Call your doctor  for medical advice about side effects. You may report side effects to FDA at 1-800-FDA-1088. Where should I keep my medication? Keep out of the reach of children and pets. You will be instructed on how to store this medication. Throw away any unused medication after the expiration date on the label. NOTE: This sheet is a summary. It may not cover all possible information. If you have questions about this medicine, talk to your doctor, pharmacist, or health care provider.  2022 Elsevier/Gold Standard  (2020-07-12 00:00:00)

## 2021-07-24 ENCOUNTER — Encounter: Payer: Self-pay | Admitting: Neurology

## 2021-07-24 MED ORDER — EMGALITY (300 MG DOSE) 100 MG/ML ~~LOC~~ SOSY: 300.0000 mg | PREFILLED_SYRINGE | SUBCUTANEOUS | 3 refills | Status: DC

## 2021-07-29 ENCOUNTER — Telehealth: Payer: Self-pay | Admitting: *Deleted

## 2021-07-29 NOTE — Telephone Encounter (Signed)
Troy [name] (Key: [id number]) Rx #: [id number] Emgality 100MG /ML syringes (episodic cluster headache)  Waiting on approval

## 2021-07-30 NOTE — Telephone Encounter (Addendum)
Approved, Coverage Starts on: 06/29/2021 12:00:00 AM, Coverage Ends on: 06/28/2022 12:00:00 AM.     Will contact patient through mychart about approval

## 2021-07-31 DIAGNOSIS — G44009 Cluster headache syndrome, unspecified, not intractable: Secondary | ICD-10-CM | POA: Diagnosis not present

## 2021-08-01 ENCOUNTER — Encounter: Payer: Self-pay | Admitting: Neurology

## 2021-08-01 DIAGNOSIS — G44019 Episodic cluster headache, not intractable: Secondary | ICD-10-CM

## 2021-08-06 NOTE — Telephone Encounter (Signed)
Denyse Amass, RN; Vanessa Ralphs got it      Previous Messages   ----- Message -----  From: Brandon Melnick, RN  Sent: 08/05/2021   4:26 PM EST  To: [name], *  Subject: oxygen for cluster headaches                   New order in Epic for this pt.   Troy [name] "Mikki Santee"  Male, 66 y.o., Jun 13, 1956  MRN:  [id number]  Thanks Newman Pies

## 2021-08-07 DIAGNOSIS — M9903 Segmental and somatic dysfunction of lumbar region: Secondary | ICD-10-CM | POA: Diagnosis not present

## 2021-08-07 DIAGNOSIS — M5442 Lumbago with sciatica, left side: Secondary | ICD-10-CM | POA: Diagnosis not present

## 2021-08-07 DIAGNOSIS — M9905 Segmental and somatic dysfunction of pelvic region: Secondary | ICD-10-CM | POA: Diagnosis not present

## 2021-08-07 DIAGNOSIS — M9902 Segmental and somatic dysfunction of thoracic region: Secondary | ICD-10-CM | POA: Diagnosis not present

## 2021-08-08 DIAGNOSIS — M545 Low back pain, unspecified: Secondary | ICD-10-CM | POA: Diagnosis not present

## 2021-08-11 DIAGNOSIS — M9903 Segmental and somatic dysfunction of lumbar region: Secondary | ICD-10-CM | POA: Diagnosis not present

## 2021-08-11 DIAGNOSIS — M9905 Segmental and somatic dysfunction of pelvic region: Secondary | ICD-10-CM | POA: Diagnosis not present

## 2021-08-11 DIAGNOSIS — M5442 Lumbago with sciatica, left side: Secondary | ICD-10-CM | POA: Diagnosis not present

## 2021-08-11 DIAGNOSIS — M9902 Segmental and somatic dysfunction of thoracic region: Secondary | ICD-10-CM | POA: Diagnosis not present

## 2021-08-13 DIAGNOSIS — D225 Melanocytic nevi of trunk: Secondary | ICD-10-CM | POA: Diagnosis not present

## 2021-08-13 DIAGNOSIS — L821 Other seborrheic keratosis: Secondary | ICD-10-CM | POA: Diagnosis not present

## 2021-08-13 DIAGNOSIS — L570 Actinic keratosis: Secondary | ICD-10-CM | POA: Diagnosis not present

## 2021-08-13 DIAGNOSIS — L814 Other melanin hyperpigmentation: Secondary | ICD-10-CM | POA: Diagnosis not present

## 2021-08-18 DIAGNOSIS — M545 Low back pain, unspecified: Secondary | ICD-10-CM | POA: Diagnosis not present

## 2021-08-20 ENCOUNTER — Encounter: Payer: Self-pay | Admitting: Neurology

## 2021-08-25 DIAGNOSIS — M9902 Segmental and somatic dysfunction of thoracic region: Secondary | ICD-10-CM | POA: Diagnosis not present

## 2021-08-25 DIAGNOSIS — M9903 Segmental and somatic dysfunction of lumbar region: Secondary | ICD-10-CM | POA: Diagnosis not present

## 2021-08-25 DIAGNOSIS — M9905 Segmental and somatic dysfunction of pelvic region: Secondary | ICD-10-CM | POA: Diagnosis not present

## 2021-08-25 DIAGNOSIS — M5442 Lumbago with sciatica, left side: Secondary | ICD-10-CM | POA: Diagnosis not present

## 2021-08-28 DIAGNOSIS — M545 Low back pain, unspecified: Secondary | ICD-10-CM | POA: Diagnosis not present

## 2021-09-15 DIAGNOSIS — M545 Low back pain, unspecified: Secondary | ICD-10-CM | POA: Diagnosis not present

## 2021-09-23 DIAGNOSIS — M545 Low back pain, unspecified: Secondary | ICD-10-CM | POA: Diagnosis not present

## 2021-09-24 DIAGNOSIS — M9903 Segmental and somatic dysfunction of lumbar region: Secondary | ICD-10-CM | POA: Diagnosis not present

## 2021-09-24 DIAGNOSIS — M5442 Lumbago with sciatica, left side: Secondary | ICD-10-CM | POA: Diagnosis not present

## 2021-09-24 DIAGNOSIS — M9902 Segmental and somatic dysfunction of thoracic region: Secondary | ICD-10-CM | POA: Diagnosis not present

## 2021-09-24 DIAGNOSIS — M9905 Segmental and somatic dysfunction of pelvic region: Secondary | ICD-10-CM | POA: Diagnosis not present

## 2021-09-29 DIAGNOSIS — M9905 Segmental and somatic dysfunction of pelvic region: Secondary | ICD-10-CM | POA: Diagnosis not present

## 2021-09-29 DIAGNOSIS — M5442 Lumbago with sciatica, left side: Secondary | ICD-10-CM | POA: Diagnosis not present

## 2021-09-29 DIAGNOSIS — M9903 Segmental and somatic dysfunction of lumbar region: Secondary | ICD-10-CM | POA: Diagnosis not present

## 2021-09-29 DIAGNOSIS — M9902 Segmental and somatic dysfunction of thoracic region: Secondary | ICD-10-CM | POA: Diagnosis not present

## 2021-10-07 DIAGNOSIS — M545 Low back pain, unspecified: Secondary | ICD-10-CM | POA: Diagnosis not present

## 2021-11-12 ENCOUNTER — Other Ambulatory Visit: Payer: Self-pay | Admitting: *Deleted

## 2021-11-12 DIAGNOSIS — G44019 Episodic cluster headache, not intractable: Secondary | ICD-10-CM

## 2021-11-12 MED ORDER — EMGALITY (300 MG DOSE) 100 MG/ML ~~LOC~~ SOSY: 300.0000 mg | PREFILLED_SYRINGE | SUBCUTANEOUS | 3 refills | Status: DC

## 2021-11-28 DIAGNOSIS — M5136 Other intervertebral disc degeneration, lumbar region: Secondary | ICD-10-CM | POA: Diagnosis not present

## 2021-11-28 DIAGNOSIS — M5416 Radiculopathy, lumbar region: Secondary | ICD-10-CM | POA: Diagnosis not present

## 2021-12-04 DIAGNOSIS — M545 Low back pain, unspecified: Secondary | ICD-10-CM | POA: Diagnosis not present

## 2021-12-11 DIAGNOSIS — M545 Low back pain, unspecified: Secondary | ICD-10-CM | POA: Diagnosis not present

## 2022-01-02 DIAGNOSIS — M5451 Vertebrogenic low back pain: Secondary | ICD-10-CM | POA: Diagnosis not present

## 2022-01-07 DIAGNOSIS — M545 Low back pain, unspecified: Secondary | ICD-10-CM | POA: Diagnosis not present

## 2022-04-22 ENCOUNTER — Telehealth: Payer: Self-pay

## 2022-04-22 DIAGNOSIS — L820 Inflamed seborrheic keratosis: Secondary | ICD-10-CM | POA: Diagnosis not present

## 2022-04-22 NOTE — Patient Outreach (Signed)
  Care Coordination   04/22/2022 Name: Troy [name] MRN: [id number] DOB: 06/10/1956   Care Coordination Outreach Attempts:  An unsuccessful telephone outreach was attempted today to offer the patient information about available care coordination services as a benefit of their health plan.   Follow Up Plan:  Additional outreach attempts will be made to offer the patient care coordination information and services.   Encounter Outcome:  No Answer  Care Coordination Interventions Activated:  No   Care Coordination Interventions:  No, not indicated   Peter Garter RN, BSN,CCM, [facility] [phone]

## 2022-06-04 DIAGNOSIS — Z961 Presence of intraocular lens: Secondary | ICD-10-CM | POA: Diagnosis not present

## 2022-06-04 DIAGNOSIS — H524 Presbyopia: Secondary | ICD-10-CM | POA: Diagnosis not present

## 2022-06-05 ENCOUNTER — Other Ambulatory Visit: Payer: Self-pay | Admitting: Thoracic Surgery (Cardiothoracic Vascular Surgery)

## 2022-06-05 DIAGNOSIS — I7121 Aneurysm of the ascending aorta, without rupture: Secondary | ICD-10-CM

## 2022-06-17 ENCOUNTER — Telehealth: Payer: Self-pay | Admitting: *Deleted

## 2022-06-17 ENCOUNTER — Encounter: Payer: Self-pay | Admitting: *Deleted

## 2022-06-17 NOTE — Patient Outreach (Signed)
  Care Coordination   Initial Visit Note   06/17/2022 Name: Troy [name] MRN: [id number] DOB: 09-13-1955  Troy [name] is a 66 y.o. year old male who sees Koirala, Dibas, MD for primary care. I spoke with  Janice Coffin by phone today.  What matters to the patients health and wellness today?  No needs    Goals Addressed             This Visit's Progress    COMPLETED: Care coordination activity       Care Coordination Interventions: Reviewed medications with patient and discussed adherence with no needed refills Reviewed scheduled/upcoming provider appointments including sufficient transportation source Screening for signs and symptoms of depression related to chronic disease state  Assessed social determinant of health barriers Educated on care management services as pt opt to declined with no needs today.         SDOH assessments and interventions completed:  Yes  SDOH Interventions Today    Flowsheet Row Most Recent Value  SDOH Interventions   Food Insecurity Interventions Intervention Not Indicated  Housing Interventions Intervention Not Indicated  Transportation Interventions Intervention Not Indicated  Utilities Interventions Intervention Not Indicated        Care Coordination Interventions:  Yes, provided   Follow up plan: No further intervention required.   Encounter Outcome:  Pt. Visit Completed   Elliot Cousin, RN Care Management Coordinator [facility] Main Office [phone]

## 2022-06-17 NOTE — Patient Instructions (Signed)
Visit Information  Thank you for taking time to visit with me today. Please don't hesitate to contact me if I can be of assistance to you.   Following are the goals we discussed today:   Goals Addressed             This Visit's Progress    COMPLETED: Care coordination activity       Care Coordination Interventions: Reviewed medications with patient and discussed adherence with no needed refills Reviewed scheduled/upcoming provider appointments including sufficient transportation source Screening for signs and symptoms of depression related to chronic disease state  Assessed social determinant of health barriers Educated on care management services as pt opt to declined with no needs today.         Please call the care guide team at [phone] if you need to cancel or reschedule your appointment.   If you are experiencing a Mental Health or Behavioral Health Crisis or need someone to talk to, please call the Suicide and Crisis Lifeline: 988 call the [place] National Suicide Prevention Lifeline: (716)500-1780 or TTY: 4158867623 TTY 682-652-0974) to talk to a trained counselor call 1-800-273-TALK (toll free, 24 hour hotline)  Patient verbalizes understanding of instructions and care plan provided today and agrees to view in MyChart. Active MyChart status and patient understanding of how to access instructions and care plan via MyChart confirmed with patient.     No further follow up required: No needs  Elliot Cousin, RN Care Management Coordinator [facility] Main [facility] [phone]

## 2022-07-10 ENCOUNTER — Ambulatory Visit
Admission: RE | Admit: 2022-07-10 | Discharge: 2022-07-10 | Disposition: A | Discharge status: Home / Self Care | Payer: Medicare PPO | Source: Ambulatory Visit | Attending: Thoracic Surgery (Cardiothoracic Vascular Surgery) | Admitting: Thoracic Surgery (Cardiothoracic Vascular Surgery)

## 2022-07-10 DIAGNOSIS — I7121 Aneurysm of the ascending aorta, without rupture: Secondary | ICD-10-CM

## 2022-07-10 DIAGNOSIS — R911 Solitary pulmonary nodule: Secondary | ICD-10-CM | POA: Diagnosis not present

## 2022-07-10 DIAGNOSIS — I712 Thoracic aortic aneurysm, without rupture, unspecified: Secondary | ICD-10-CM | POA: Diagnosis not present

## 2022-07-10 MED ORDER — IOPAMIDOL (ISOVUE-370) INJECTION 76%
75.0000 mL | Freq: Once | INTRAVENOUS | Status: AC | PRN
  Administered 2022-07-10: 75 mL via INTRAVENOUS

## 2022-07-14 ENCOUNTER — Ambulatory Visit: Payer: Medicare PPO | Admitting: Physician Assistant

## 2022-07-14 VITALS — BP 139/87 | HR 60 | Resp 20 | Wt 200.0 lb

## 2022-07-14 DIAGNOSIS — I7121 Aneurysm of the ascending aorta, without rupture: Secondary | ICD-10-CM | POA: Diagnosis not present

## 2022-07-14 NOTE — Progress Notes (Signed)
Troy [name]       [place] [address]             [id number]    PCP is Lujean Amel, MD Referring Provider is Volanda Napoleon, MD  Chief Complaint: Ascending thoracic aortic aneurysm   HPI: This is a 67 year old with a past medical history of hypercholesterolemia, hypertension, depression, fibromyalgia, cluster headaches, stroke who was found to have an ascending thoracic aortic aneurysm in 2019 that at that time measured 4.3 cm.  He denies chest or back pain, LE edema, or shortness of breath.   Past Medical History:  Diagnosis Date   Cluster headaches    "q couple years" (07/15/2015)   Depression    "mood stabilizer"   Fibromyalgia    Hypercholesterolemia    Hypertension    PFO with atrial septal aneurysm 07/15/2015   s/p 35 mm Amplatzer PFO device by Dr. Burt Knack 07/15/2015 // Echocardiogram 02/2019: EF 55-60, Gr 1 DD, mod LAE, mild TR, mild to mod aortic sclerosis, ascending aorta 38 mm, PFO closure device present without residual shunt   Stroke ([place]) 11/2014; 05/2015   denies residual from either on 07/15/2015   Thoracic aortic aneurysm ([place])    CT 12/19: 4.3 cm    Past Surgical History:  Procedure Laterality Date   CARDIAC CATHETERIZATION N/A 07/15/2015   Procedure: PFO Closure;  Surgeon: Sherren Mocha, MD;  Location: [facility];  Service: Cardiovascular;  Laterality: N/A;   KNEE ARTHROSCOPY Right 2013   "meniscus repair"   MEDIASTINOSCOPY N/A 11/15/2017   Procedure: MEDIASTINOSCOPY;  Surgeon: Melrose Nakayama, MD;  Location: [facility] OR;  Service: Thoracic;  Laterality: N/A;   PATENT FORAMEN OVALE CLOSURE  07/15/2015   SHOULDER OPEN ROTATOR CUFF REPAIR Right 1990    Family History  Problem Relation Age of Onset   Headache Father     Social History Social History   Tobacco Use   Smoking status: Never   Smokeless tobacco: Never  Vaping Use   Vaping Use: Never used  Substance Use Topics   Alcohol use: Yes    Alcohol/week: 5.0  standard drinks of alcohol    Types: 5 Cans of beer per week   Drug use: No    Current Outpatient Medications  Medication Sig Dispense Refill   amLODipine (NORVASC) 2.5 MG tablet Take 5 mg by mouth daily.      aspirin EC 81 MG tablet Take 1 tablet (81 mg total) by mouth daily. 30 tablet 0   atorvastatin (LIPITOR) 20 MG tablet Take 20 mg by mouth daily.   7   Galcanezumab-gnlm (EMGALITY, 300 MG DOSE,) 100 MG/ML SOSY Inject 300 mg into the skin every 30 (thirty) days. During an episodic cluster flare. 9 mL 3   SUMAtriptan (IMITREX) 100 MG tablet Take 1 tablet (100 mg total) by mouth once as needed for up to 1 dose. May repeat in 2 hours if headache persists or recurs. Max 2x a day. 27 tablet 3   traZODone (DESYREL) 50 MG tablet Take 50 mg by mouth at bedtime.     triamterene-hydrochlorothiazide (MAXZIDE-25) 37.5-25 MG tablet Take 1 tablet by mouth daily.  0  Allergies: No Known Allergies  Review of Systems:  Chest Pain [  ] Resting SOB [ ]  Exertional SOB [  ]  Pedal Edema [  ] Syncope [  ] Presyncope [  ]  General Review of Systems: [Y] = yes [ N]=no  Consitutional:  nausea [ ] ;  fever [ ] ;  Eye : blurred vision [ ] ; Amaurosis fugax[  ];  Resp: cough [ ] ;  hemoptysis[ ] ;  GI: vomiting[ ] ; melena[ ] ; hematochezia [] ;  GU:hematuria[ ] ; Musculoskeletal: myalgias[ ] ; joint swelling[  ]; joint erythema[ ] ;  Heme/Lymph: anemia[ ] ;  Neuro: TIA[ ] ;stroke[ ] ;  seizures[ ] ;  Endocrine: diabetes[ ] ;   Vital Signs:  Physical Exam CV Pulmonary Neck Abdomen Extremities Neurologic  Diagnostic Tests:  Narrative & Impression  CLINICAL DATA:  Aortic aneurysm suspected asymptomatic.   EXAM: CT ANGIOGRAPHY CHEST WITH CONTRAST   TECHNIQUE: Multidetector CT imaging of the chest was performed using the standard protocol during bolus administration of intravenous contrast. Multiplanar CT image reconstructions and MIPs were obtained to evaluate the vascular anatomy.   RADIATION DOSE  REDUCTION: This exam was performed according to the departmental dose-optimization program which includes automated exposure control, adjustment of the mA and/or kV according to patient size and/or use of iterative reconstruction technique.   CONTRAST:  34mL ISOVUE-370 IOPAMIDOL (ISOVUE-370) INJECTION 76%   COMPARISON:  CT examination dated June 18, 2020   FINDINGS: Cardiovascular: Preferential opacification of the thoracic aorta. Stable ascending aortic aneurysm measuring up to 4.2 cm. Aortic arch and descending thoracic aorta are normal in caliber. There is common origin of the left common carotid and brachiocephalic trunk, bovine variant of the arch anatomy without stenosis. The heart is normal in size interatrial septal closure device is again noted. Normal heart size. No pericardial effusion.   Mediastinum/Nodes: No enlarged mediastinal, hilar, or axillary lymph nodes. Thyroid gland, trachea, and esophagus demonstrate no significant findings.   Lungs/Pleura: Lungs are clear. Stable 4 mm nodule in the lingula (series 4, image 65) no pleural effusion or pneumothorax.   Upper Abdomen: No acute abnormality.   Musculoskeletal: No chest wall abnormality. No acute or significant osseous findings.   Review of the MIP images confirms the above findings.   IMPRESSION: 1. Stable ascending aortic aneurysm measuring up to 4.2 cm. Recommend semi-annual imaging followup by CTA or MRA and referral to cardiothoracic surgery if not already obtained. This recommendation follows 2010 ACCF/AHA/AATS/ACR/ASA/SCA/SCAI/SIR/STS/SVM Guidelines for the Diagnosis and Management of Patients With Thoracic Aortic Disease. Circulation. 2010; 1212011. Aortic aneurysm NOS (ICD10-I71.9) 2. Stable 4 mm nodule in the lingula. 3. No acute cardiopulmonary process.     Electronically Signed   By: : [id number] D.O.   On: 07/10/2022 16:07    Impression and Plan: Risk Modification in those with  ascending thoracic aortic aneurysm:  Continue good control of blood pressure (prefer SBP 130/80 or less)-continue Maxzide and Amlodipine  2. Avoid fluoroquinolone antibiotics (I.e Ciprofloxacin, Avelox, Levofloxacin, Ofloxacin)  3.  Use of statin (to decrease cardiovascular risk)-continue Atorvastatin  4.  Exercise and activity limitations is individualized, but in general, contact sports are to be  avoided and one should avoid heavy lifting (defined as half of ideal body weight) and exercises involving sustained Valsalva maneuver.  5. Counseling for those suspected of having genetically mediated disease. First-degree relatives of those with TAA disease should be screened as well as those who have a connective tissue disease (I.e with Marfan syndrome, Ehlers-Danlos syndrome,  and Loeys-Dietz syndrome) or a  bicuspid aortic valve,have an increased risk for  complications related to TAA. The aforementioned does not seem to apply to patient.  6. He does not have a history of tobacco abuse     14/05/2023, PA-C [facility] [phone]

## 2022-07-14 NOTE — Patient Instructions (Signed)
Risk Modification in those with ascending thoracic aortic aneurysm:  Continue good control of blood pressure (prefer SBP 130/80 or less)  2. Avoid fluoroquinolone antibiotics (I.e Ciprofloxacin, Avelox, Levofloxacin, Ofloxacin)  3.  Use of statin (to decrease cardiovascular risk)  4.  Exercise and activity limitations is individualized, but in general, contact sports are to be  avoided and one should avoid heavy lifting (defined as half of ideal body weight) and exercises involving sustained Valsalva maneuver.  5. Counseling for those suspected of having genetically mediated disease. First-degree relatives of those with TAA disease should be screened as well as those who have a connective tissue disease (I.e with Marfan syndrome, Ehlers-Danlos syndrome,  and Loeys-Dietz syndrome) or a  bicuspid aortic valve,have an increased risk for  complications related to TAA  6. If one has tobacco abuse, smoking cessation is highly encouraged.  

## 2022-07-14 NOTE — Progress Notes (Signed)
[address]       [place], [address]             [phone]        Troy [name] [id number] 01-01-1956  History of Present Illness: Mr. Farace is a 67 year old male with a past medical history of PFO closure with an Amplatzer device, hypercholesterolemia, hypertension, depression, fibromyalgia, cluster headaches, stroke and mediastinal adenopathy secondary to sarcoidosis. He was found to have a 4.3cm ascending thoracic aneurysm in 2019 which has been stable on CTA since then. His mediastinal adenopathy has since resolved and he has been followed by pulmonary medicine for this. His last echocardiogram in 03/21/19 showed a tricuspid aortic valve. He denies chest pain and tightness, shortness of breath, and dizziness.   Current Outpatient Medications on File Prior to Visit  Medication Sig Dispense Refill   amLODipine (NORVASC) 2.5 MG tablet Take 5 mg by mouth daily.      aspirin EC 81 MG tablet Take 1 tablet (81 mg total) by mouth daily. 30 tablet 0   atorvastatin (LIPITOR) 20 MG tablet Take 20 mg by mouth daily.   7   Galcanezumab-gnlm (EMGALITY, 300 MG DOSE,) 100 MG/ML SOSY Inject 300 mg into the skin every 30 (thirty) days. During an episodic cluster flare. 9 mL 3   SUMAtriptan (IMITREX) 100 MG tablet Take 1 tablet (100 mg total) by mouth once as needed for up to 1 dose. May repeat in 2 hours if headache persists or recurs. Max 2x a day. 27 tablet 3   traZODone (DESYREL) 50 MG tablet Take 50 mg by mouth at bedtime.     triamterene-hydrochlorothiazide (MAXZIDE-25) 37.5-25 MG tablet Take 1 tablet by mouth daily.  0   No current facility-administered medications on file prior to visit.   Vitals: Today's Vitals   07/14/22 1447  BP: 139/87  Pulse: 60  Resp: 20  SpO2: 97%  Weight: 200 lb (90.7 kg)   Body mass index is 27.12 kg/m.   Physical Exam General: No distress Neuro: Grossly intact Neck: No carotid bruits, no lymphadenopathy CV: Regular rate and  rhythm, no murmur Pulm: Clear to auscultation bilaterally  CTA Results: CLINICAL DATA:  Aortic aneurysm suspected asymptomatic.   EXAM: CT ANGIOGRAPHY CHEST WITH CONTRAST   TECHNIQUE: Multidetector CT imaging of the chest was performed using the standard protocol during bolus administration of intravenous contrast. Multiplanar CT image reconstructions and MIPs were obtained to evaluate the vascular anatomy.   RADIATION DOSE REDUCTION: This exam was performed according to the departmental dose-optimization program which includes automated exposure control, adjustment of the mA and/or kV according to patient size and/or use of iterative reconstruction technique.   CONTRAST:  4mL ISOVUE-370 IOPAMIDOL (ISOVUE-370) INJECTION 76%   COMPARISON:  CT examination dated June 18, 2020   FINDINGS: Cardiovascular: Preferential opacification of the thoracic aorta. Stable ascending aortic aneurysm measuring up to 4.2 cm. Aortic arch and descending thoracic aorta are normal in caliber. There is common origin of the left common carotid and brachiocephalic trunk, bovine variant of the arch anatomy without stenosis. The heart is normal in size interatrial septal closure device is again noted. Normal heart size. No pericardial effusion.   Mediastinum/Nodes: No enlarged mediastinal, hilar, or axillary lymph nodes. Thyroid gland, trachea, and esophagus demonstrate no significant findings.   Lungs/Pleura: Lungs are clear. Stable 4 mm nodule in the lingula (series 4, image 65) no pleural effusion or pneumothorax.   Upper Abdomen: No acute  abnormality.   Musculoskeletal: No chest wall abnormality. No acute or significant osseous findings.   Review of the MIP images confirms the above findings.   IMPRESSION: 1. Stable ascending aortic aneurysm measuring up to 4.2 cm. Recommend semi-annual imaging followup by CTA or MRA and referral to cardiothoracic surgery if not already obtained. This  recommendation follows 2010 ACCF/AHA/AATS/ACR/ASA/SCA/SCAI/SIR/STS/SVM Guidelines for the Diagnosis and Management of Patients With Thoracic Aortic Disease. Circulation. 2010; 121: I097-D53. Aortic aneurysm NOS (ICD10-I71.9) 2. Stable 4 mm nodule in the lingula. 3. No acute cardiopulmonary process.     Electronically Signed   By: Keane Police D.O.   On: 07/10/2022 16:07    A/P:  Ascending Thoracic Aortic Aneurysm: Patient has a stable 4.2cm ascending thoracic aortic aneurysm on CTA. His blood pressure is well controlled on Norvasc 2.5mg  daily and he is closely followed by his primary care physician. Will plan to continue annual surveillance with CTA.   Lung Nodule: Stable 21mm nodule in the left lingula  Risk Modification Reviewed:  Statin:  Atorvastatin 20mg  daily  Smoking cessation instruction/counseling given:  Nonsmoker  Patient was counseled on importance of Blood Pressure Control.  Despite Medical intervention if the patient notices persistently elevated blood pressure readings, they are instructed to contact their Primary Care Physician  Please avoid use of Fluoroquinolones as this can potentially increase your risk of Aortic Rupture and/or Dissection  Exercise and activity limitations is individualized, but in general, contact sports are to be  avoided and one should avoid heavy lifting (defined as half of ideal body weight) and exercises involving sustained Valsalva maneuver.  Patient educated on signs and symptoms of Aortic Dissection, handout also provided in AVS  Magdalene River, PA-C 07/14/22

## 2022-07-27 ENCOUNTER — Telehealth: Payer: Medicare PPO | Admitting: Neurology

## 2022-07-27 ENCOUNTER — Encounter: Payer: Self-pay | Admitting: Neurology

## 2022-07-27 ENCOUNTER — Other Ambulatory Visit: Payer: Self-pay | Admitting: *Deleted

## 2022-07-27 ENCOUNTER — Telehealth: Payer: Self-pay | Admitting: Neurology

## 2022-07-27 DIAGNOSIS — G44019 Episodic cluster headache, not intractable: Secondary | ICD-10-CM | POA: Diagnosis not present

## 2022-07-27 MED ORDER — SUMATRIPTAN SUCCINATE 100 MG PO TABS: 100.0000 mg | ORAL_TABLET | Freq: Once | ORAL | 3 refills | Status: DC | PRN

## 2022-07-27 MED ORDER — EMGALITY (300 MG DOSE) 100 MG/ML ~~LOC~~ SOSY: 300.0000 mg | PREFILLED_SYRINGE | SUBCUTANEOUS | 3 refills | Status: DC

## 2022-07-27 MED ORDER — SUMATRIPTAN SUCCINATE 100 MG PO TABS: 100.0000 mg | ORAL_TABLET | Freq: Once | ORAL | 3 refills | Status: AC | PRN

## 2022-07-27 NOTE — Telephone Encounter (Signed)
Please schedule with one year f/u with dr Jaynee Eagles over video thanks

## 2022-07-27 NOTE — Telephone Encounter (Signed)
Sent mychart msg informing pt of follow up made with Dr. Jaynee Eagles

## 2022-07-27 NOTE — Progress Notes (Signed)
[facility]    Provider:  Dr Jaynee Eagles Requesting Provider: Lujean Amel, MD Primary Care Provider:  Lujean Amel, MD  Virtual Visit via Video Note  I connected with Troy [name] on 07/27/22 at  1:30 PM EST by a video enabled telemedicine application and verified that I am speaking with the correct person using two identifiers.  Location: Patient: home Provider: work/office   I discussed the limitations of evaluation and management by telemedicine and the availability of in person appointments. The patient expressed understanding and agreed to proceed.  History of Present Illness:   Follow Up Instructions:    I discussed the assessment and treatment plan with the patient. The patient was provided an opportunity to ask questions and all were answered. The patient agreed with the plan and demonstrated an understanding of the instructions.   The patient was advised to call back or seek an in-person evaluation if the symptoms worsen or if the condition fails to improve as anticipated.  I provided over 20 minutes of non-face-to-face time during this encounter.   Melvenia Beam, MD   CC:  cluster headaches  Follow-up 07/27/2022: Patient is here for follow-up for episodic cluster headaches we saw him about a year ago and discussed the plan.  Also past medical history of depression, fibromyalgia, hypertension, PFO involved with stroke, thoracic aortic aneurysm and at that time we discussed acute and chronic treatments.  Doing great, no cluster episodes while on emgality, can try to decrease dose every month and then use 300mg  when in an episode. Continue current regimen. He will call asap if starts a cluster but hopefully the emgality will keep him from having his cluster headache episodes continue.  Patient complains of symptoms per HPI as well as the following symptoms: none . Pertinent negatives and positives per HPI. All others negative   HPI 07/23/2021:   Troy [name] is a 67 y.o. male here as requested by Lujean Amel, MD for cluster headaches. PMHx cluster headaches, depression, fibromyalgia, hypertension, PFO with atrial septal aneurysm July 15, 2015 involved with stroke, thoracic aortic aneurysm.  I reviewed my prior notes from 2018, he has episodic cluster headaches, saw me years ago in 2018, at that time we discussed acute and chronic management of medications, and he decided not to start verapamil and just use acute, we gave him Imitrex injectable and started steroids.  I reviewed charts and MRI images below.  Today he is back again for episodic cluster headaches, I reviewed Dr. Dorthy Cooler  notes: History of cluster headaches, goes back many years, last episode was a few years ago, he had improved since he had his PFO thought it might of been some relation to it, but for for the last 3 to 4 days he has been getting pain, left-sided, orbital with some tearing no vision issues no nausea vomiting, they prescribed Zembrace but he never picked it up due to prior Auth, he is to use ergot's.  He was given oral Imitrex.  Medications tried that can be used in headache or episodic cluster headache management: Norvasc, verapamil, aspirin, Tylenol, Imitrex injectable, Imitrex oral, prednisone injections and high-dose prednisone oral, Valium, Voltaren, lidocaine injection, lorazepam, methylprednisolone oral, Zofran, oxycodone, Paxil, Phenergan, oxygen, trazodone, gabapentin.  Lithium contraindicated due to mood disorder that is not bipolar and already on trazodone; lithium has not been proven effective for depression unless in the setting of bipolar and patient has depression.  I would prefer to stay away from lithium since he is being  treated with trazodone.  We had an extended visit today, patient thought when he had his PFO fixed maybe the clusters had gone away but he has been in the throes of an episode the last 10 days, I have not seen him since 2018, he  returns today with horrible cluster headaches, they usually last about a month, they are severe, stabbing excruciating orbital or periorbital pain, with autonomic symptoms, his father had cluster headaches as well, he has had them for years, we have talked about cluster headaches in the past, foods that may trigger, however he does not seem to see a pattern with foods but he is staying away from alcohol.  Since his episodes come every several years, in the past he has declined verapamil but he got a prescription from his primary care and had side effects.  At this point we talked about what we do.  We gave him oxygen in the past but by the time we get that his episode will almost be over.  We talked about steroids, treatment, he is episodes can last upwards of 3 hours, likely Imitrex tabs help, some brace was too expensive for him even when his primary care had the PA approved, nothing seems to trigger it unknown.  Severe.No other focal neurologic deficits, associated symptoms, inciting events or modifiable factors.     Reviewed notes, labs and imaging from outside physicians, which showed:  MRI brain 2016: FINDINGS: reviewed report Cerebral volume is within normal limits for age. Major intracranial vascular flow voids are preserved. There is a mild degree of intracranial artery dolichoectasia. Subtle focus of abnormal trace diffusion in the right motor strip series 3, image 38. This is evident on the ADC map (image 38). There is also a small linear right parietal lobe subcortical white matter focus of restricted diffusion (series 3, image 33). Tiny focus also seen in the right caudate nucleus on image 30. There also appears to be a right lateral occipital lobe cortical focus along the posterior margin of the right MCA territory (image 24).   No left hemisphere or posterior fossa restricted diffusion.   No acute intracranial hemorrhage identified. No midline shift, mass effect, or evidence of  intracranial mass lesion. Mild if any associated T2 hyperintensity at the abnormal diffusion sites.   Chronic micro hemorrhage in the inferior left cerebellum. There is a small focus of left MCA cortical encephalomalacia on series 7, image 23.   Negative visualized cervical spine. No ventriculomegaly. Normal bone marrow signal. Visible internal auditory structures appear normal. Mastoids are clear. Trace paranasal sinus mucosal thickening. Orbit and scalp soft tissues within normal limits.   IMPRESSION: 1. Scattered small/punctate acute infarcts in the right MCA territory, including the right motor strip near the left upper extremity representation, right caudate. 2. No hemorrhage or mass effect. 3. Small chronic cortical infarct in the left MCA territory. Small chronic micro hemorrhage in the left cerebellum.  Review of Systems: Patient complains of symptoms per HPI as well as the following symptoms episodic cluster headaches. Pertinent negatives and positives per HPI. All others negative.   Social History   Socioeconomic History   Marital status: Married    Spouse name: Alona Bene   Number of children: 1   Years of education: PhD   Highest education level: Not on file  Occupational History   Occupation: [employer]  Tobacco Use   Smoking status: Never   Smokeless tobacco: Never  Vaping Use   Vaping Use: Never used  Substance and  Sexual Activity   Alcohol use: Yes    Alcohol/week: 5.0 standard drinks of alcohol    Types: 5 Cans of beer per week   Drug use: No   Sexual activity: Not Currently  Other Topics Concern   Not on file  Social History Narrative   Lives with wife Williemae Area from [place]. Moved about 5 years ago   Caffeine use: daily   Social Determinants of Health   Financial Resource Strain: Not on file  Food Insecurity: No Food Insecurity (06/17/2022)   Hunger Vital Sign    Worried About Running Out of Food in the Last Year: Never true    Ran Out of Food in  the Last Year: Never true  Transportation Needs: No Transportation Needs (06/17/2022)   PRAPARE - Administrator, Civil Service (Medical): No    Lack of Transportation (Non-Medical): No  Physical Activity: Not on file  Stress: Not on file  Social Connections: Not on file  Intimate Partner Violence: Not on file    Family History  Problem Relation Age of Onset   Headache Father     Past Medical History:  Diagnosis Date   Cluster headaches    "q couple years" (07/15/2015)   Depression    "mood stabilizer"   Fibromyalgia    Hypercholesterolemia    Hypertension    PFO with atrial septal aneurysm 07/15/2015   s/p 35 mm Amplatzer PFO device by Dr. 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   Asthma 09/15/2019   Eosinophilia 08/10/2019   Sarcoidosis 07/12/2019   Chronic cough 07/12/2019   Thoracic aortic aneurysm (HCC)    Mediastinal adenopathy 11/11/2017   Cluster headache 03/11/2016   PFO with atrial septal aneurysm 07/15/2015   Acute CVA (cerebrovascular accident) (HCC) 06/09/2015   TIA (transient ischemic attack) 06/08/2015   Stroke (cerebrum) (HCC) 06/08/2015   Aphasia 06/08/2015   Left arm numbness 06/08/2015   Hypertension     Past Surgical History:  Procedure Laterality Date   CARDIAC CATHETERIZATION N/A 07/15/2015   Procedure: PFO Closure;  Surgeon: Tonny Bollman, MD;  Location: [facility] INVASIVE CV LAB;  Service: Cardiovascular;  Laterality: N/A;   KNEE ARTHROSCOPY Right 2013   "meniscus repair"   MEDIASTINOSCOPY N/A 11/15/2017   Procedure: MEDIASTINOSCOPY;  Surgeon: Loreli Slot, MD;  Location: [facility]  OR;  Service: Thoracic;  Laterality: N/A;   PATENT FORAMEN OVALE CLOSURE  07/15/2015   SHOULDER OPEN ROTATOR CUFF REPAIR Right 1990    Current Outpatient Medications  Medication Sig Dispense Refill   amLODipine (NORVASC) 2.5 MG tablet Take 5 mg by mouth daily.      aspirin EC 81 MG tablet Take 1 tablet (81 mg total) by mouth daily. 30 tablet 0   atorvastatin (LIPITOR) 20 MG tablet Take 20 mg by mouth daily.   7   Galcanezumab-gnlm (EMGALITY, 300 MG DOSE,) 100 MG/ML SOSY Inject 300 mg into the skin every 30 (thirty) days. During an episodic cluster flare. 9 mL 3   SUMAtriptan (IMITREX) 100 MG tablet Take 1 tablet (100 mg total) by mouth once as  needed for up to 1 dose. May repeat in 2 hours if headache persists or recurs. Max 2x a day. 27 tablet 3   traZODone (DESYREL) 50 MG tablet Take 50 mg by mouth at bedtime.     triamterene-hydrochlorothiazide (MAXZIDE-25) 37.5-25 MG tablet Take 1 tablet by mouth daily.  0   No current facility-administered medications for this visit.    Allergies as of 07/27/2022   (No Known Allergies)    Vitals: There were no vitals taken for this visit. Last Weight:  Wt Readings from Last 1 Encounters:  07/14/22 200 lb (90.7 kg)   Last Height:   Ht Readings from Last 1 Encounters:  07/23/21 6' (1.829 m)     Physical exam: Exam: Gen: NAD, conversant      CV: No aparent palpitations or chest pain or SOB. VS: Breathing at a normal rate. Weight appears within normal limits. Not febrile. Eyes: Conjunctivae clear without exudates or hemorrhage  Neuro: Detailed Neurologic Exam  Speech:    Speech is normal; fluent and spontaneous with normal comprehension.  Cognition:    The patient is oriented to person, place, and time;     recent and remote memory intact;     language fluent;     normal attention, concentration,     fund of knowledge Cranial Nerves:    The pupils are equal, round, and reactive to light. Cannot perform fundoscopic exam. Visual  fields are full to finger confrontation. Extraocular movements are intact.  The face is symmetric with normal sensation. The palate elevates in the midline. Hearing intact. Voice is normal. Shoulder shrug is normal. The tongue has normal motion without fasciculations.   Coordination:    Normal finger to nose  Gait:    Normal native gait  Motor Observation:   no involuntary movements noted. Tone:    Appears normal  Posture:    Posture is normal. normal erect    Strength:    Strength is anti-gravity and symmetric in the upper and lower limbs.      Sensation: intact to LT   Assessment/Plan:   67 y.o. male here as requested by Darrow Bussing, MD for episodic cluster headaches. PMHx cluster headaches, depression, fibromyalgia, hypertension, PFO with atrial septal aneurysm July 15, 2015 involved with stroke, thoracic aortic aneurysm.  I reviewed my prior notes from 2018, he has episodic cluster headaches, saw me years ago in 2018, at that time we discussed acute and chronic management of medications, and he decided not to start verapamil and just use acute, we gave him Imitrex injectable and started steroids.patient thought when he had his PFO fixed maybe the clusters had gone away but he has been in the throes of an episode the last 10 days, he returns today with horrible cluster headaches.  -Doing great, no cluster episodes while on emgality, can try to decrease dose every month and then use 300mg  when in an episode.   - we have talked about cluster headaches again today, foods that may trigger, however he does not seem to see a pattern with foods but he is staying away from alcohol.  Since his episodes come every several years, in the past he has declined verapamil but he got a prescription from his primary care and had side effects.     - We gave him oxygen in the past but by the time we get that his episode will almost be over.  Imitrex tabs help.  He has tried Imitrex injectable as  well  in the past.  Side effects to verapamil.  In cases of acute cluster episode often times we have to start with extremely high dose steroids 100 mg and titrate over 2 weeks to get patient immediate relief.  Luckily we do not have to do that today, Imitrex tabs help him.  Usually we do not recommend taking them more than 9 times a month but I did explain the risks of stroke and agreed to give him enough for twice a day since his clusters may only last a month.    - continue current regimen  - refill meds, discussed copay card to try for emgality  Meds ordered this encounter  Medications   Galcanezumab-gnlm (EMGALITY, 300 MG DOSE,) 100 MG/ML SOSY    Sig: Inject 300 mg into the skin every 30 (thirty) days. During an episodic cluster flare.    Dispense:  9 mL    Refill:  3    Pt wants 90  day supply   SUMAtriptan (IMITREX) 100 MG tablet    Sig: Take 1 tablet (100 mg total) by mouth once as needed for up to 1 dose. May repeat in 2 hours if headache persists or recurs. Max 2x a day.    Dispense:  27 tablet    Refill:  3    Cc: Koirala, Dibas, MD,  Koirala, Dibas, MD  Sarina Ill, MD  [facility] [address] [place], [place] [address]  Phone [phone] Fax [phone]

## 2022-08-07 ENCOUNTER — Encounter: Payer: Self-pay | Admitting: Cardiovascular Disease

## 2022-08-13 DIAGNOSIS — L821 Other seborrheic keratosis: Secondary | ICD-10-CM | POA: Diagnosis not present

## 2022-08-13 DIAGNOSIS — D2272 Melanocytic nevi of left lower limb, including hip: Secondary | ICD-10-CM | POA: Diagnosis not present

## 2022-08-13 DIAGNOSIS — D225 Melanocytic nevi of trunk: Secondary | ICD-10-CM | POA: Diagnosis not present

## 2022-09-01 DIAGNOSIS — F5101 Primary insomnia: Secondary | ICD-10-CM | POA: Diagnosis not present

## 2022-09-01 DIAGNOSIS — I10 Essential (primary) hypertension: Secondary | ICD-10-CM | POA: Diagnosis not present

## 2022-09-01 DIAGNOSIS — I77810 Thoracic aortic ectasia: Secondary | ICD-10-CM | POA: Diagnosis not present

## 2022-09-01 DIAGNOSIS — Z0001 Encounter for general adult medical examination with abnormal findings: Secondary | ICD-10-CM | POA: Diagnosis not present

## 2022-09-01 DIAGNOSIS — Z79899 Other long term (current) drug therapy: Secondary | ICD-10-CM | POA: Diagnosis not present

## 2022-09-01 DIAGNOSIS — E7800 Pure hypercholesterolemia, unspecified: Secondary | ICD-10-CM | POA: Diagnosis not present

## 2022-09-02 DIAGNOSIS — Z79899 Other long term (current) drug therapy: Secondary | ICD-10-CM | POA: Diagnosis not present

## 2022-09-02 DIAGNOSIS — E7800 Pure hypercholesterolemia, unspecified: Secondary | ICD-10-CM | POA: Diagnosis not present

## 2022-10-29 DIAGNOSIS — M79672 Pain in left foot: Secondary | ICD-10-CM | POA: Diagnosis not present

## 2022-11-05 DIAGNOSIS — M79672 Pain in left foot: Secondary | ICD-10-CM | POA: Diagnosis not present

## 2022-11-09 ENCOUNTER — Telehealth: Payer: Self-pay | Admitting: Neurology

## 2022-11-09 NOTE — Telephone Encounter (Signed)
Sent mychart msg informing pt of appt change due to provider schedule change 

## 2022-11-19 DIAGNOSIS — Z96651 Presence of right artificial knee joint: Secondary | ICD-10-CM | POA: Diagnosis not present

## 2022-11-19 DIAGNOSIS — Z96652 Presence of left artificial knee joint: Secondary | ICD-10-CM | POA: Diagnosis not present

## 2022-11-19 DIAGNOSIS — Z471 Aftercare following joint replacement surgery: Secondary | ICD-10-CM | POA: Diagnosis not present

## 2022-11-19 DIAGNOSIS — Z96653 Presence of artificial knee joint, bilateral: Secondary | ICD-10-CM | POA: Diagnosis not present

## 2022-11-24 ENCOUNTER — Other Ambulatory Visit: Payer: Self-pay | Admitting: *Deleted

## 2022-11-24 DIAGNOSIS — G44019 Episodic cluster headache, not intractable: Secondary | ICD-10-CM

## 2022-11-24 MED ORDER — EMGALITY (300 MG DOSE) 100 MG/ML ~~LOC~~ SOSY: 300.0000 mg | PREFILLED_SYRINGE | SUBCUTANEOUS | 3 refills | Status: DC

## 2022-12-01 DIAGNOSIS — J988 Other specified respiratory disorders: Secondary | ICD-10-CM | POA: Diagnosis not present

## 2022-12-01 DIAGNOSIS — R059 Cough, unspecified: Secondary | ICD-10-CM | POA: Diagnosis not present

## 2022-12-10 DIAGNOSIS — S93105D Unspecified dislocation of left toe(s), subsequent encounter: Secondary | ICD-10-CM | POA: Diagnosis not present

## 2023-03-25 DIAGNOSIS — Z79899 Other long term (current) drug therapy: Secondary | ICD-10-CM | POA: Diagnosis not present

## 2023-03-25 DIAGNOSIS — Z23 Encounter for immunization: Secondary | ICD-10-CM | POA: Diagnosis not present

## 2023-03-25 DIAGNOSIS — I10 Essential (primary) hypertension: Secondary | ICD-10-CM | POA: Diagnosis not present

## 2023-03-25 DIAGNOSIS — H9190 Unspecified hearing loss, unspecified ear: Secondary | ICD-10-CM | POA: Diagnosis not present

## 2023-03-25 DIAGNOSIS — E7800 Pure hypercholesterolemia, unspecified: Secondary | ICD-10-CM | POA: Diagnosis not present

## 2023-04-05 DIAGNOSIS — H6123 Impacted cerumen, bilateral: Secondary | ICD-10-CM | POA: Diagnosis not present

## 2023-05-31 DIAGNOSIS — I712 Thoracic aortic aneurysm, without rupture, unspecified: Secondary | ICD-10-CM | POA: Diagnosis not present

## 2023-05-31 DIAGNOSIS — I6310 Cerebral infarction due to embolism of unspecified precerebral artery: Secondary | ICD-10-CM | POA: Diagnosis not present

## 2023-05-31 DIAGNOSIS — I10 Essential (primary) hypertension: Secondary | ICD-10-CM | POA: Diagnosis not present

## 2023-05-31 DIAGNOSIS — D869 Sarcoidosis, unspecified: Secondary | ICD-10-CM | POA: Diagnosis not present

## 2023-06-07 ENCOUNTER — Other Ambulatory Visit: Payer: Self-pay | Admitting: Thoracic Surgery (Cardiothoracic Vascular Surgery)

## 2023-06-07 DIAGNOSIS — I7121 Aneurysm of the ascending aorta, without rupture: Secondary | ICD-10-CM

## 2023-07-02 ENCOUNTER — Encounter: Payer: Self-pay | Admitting: Thoracic Surgery (Cardiothoracic Vascular Surgery)

## 2023-07-12 ENCOUNTER — Ambulatory Visit
Admission: RE | Admit: 2023-07-12 | Discharge: 2023-07-12 | Disposition: A | Discharge status: Home / Self Care | Payer: Medicare PPO | Source: Ambulatory Visit | Attending: Thoracic Surgery (Cardiothoracic Vascular Surgery) | Admitting: Thoracic Surgery (Cardiothoracic Vascular Surgery)

## 2023-07-12 DIAGNOSIS — I7121 Aneurysm of the ascending aorta, without rupture: Secondary | ICD-10-CM | POA: Diagnosis not present

## 2023-07-12 MED ORDER — IOPAMIDOL (ISOVUE-370) INJECTION 76%
200.0000 mL | Freq: Once | INTRAVENOUS | Status: AC | PRN
  Administered 2023-07-12: 75 mL via INTRAVENOUS

## 2023-07-13 NOTE — Progress Notes (Signed)
[address]       [place] [address]             [phone]      Troy [name] [id number] [birth year]  History of Present Illness:   Troy [name] is a 68 yo male with history of HTN, HLD,  depression, fibromyalgia, cluster headaches, and stroke.  He was noted to have an Ascending Aortic Anuerysm back in 2019 that measured 4.3 cm.  He presents today for 1 year follow up CT scan.  Patient overall continues to do well.  He remains active and workouts in the gym.  He was instructed on avoidance of heavy lifting and HIIT training.  He denies chest pain and shortness of breath.  He is compliant with his antihypertensives.  He does not smoke  Current Outpatient Medications on File Prior to Visit  Medication Sig Dispense Refill   amLODipine (NORVASC) 2.5 MG tablet Take 5 mg by mouth daily.      aspirin EC 81 MG tablet Take 1 tablet (81 mg total) by mouth daily. 30 tablet 0   atorvastatin (LIPITOR) 20 MG tablet Take 20 mg by mouth daily.   7   Galcanezumab-gnlm (EMGALITY, 300 MG DOSE,) 100 MG/ML SOSY Inject 300 mg into the skin every 30 (thirty) days. During an episodic cluster flare. 9 mL 3   SUMAtriptan (IMITREX) 100 MG tablet Take 1 tablet (100 mg total) by mouth once as needed for up to 1 dose. May repeat in 2 hours if headache persists or recurs. Max 2x a day. 27 tablet 3   traZODone (DESYREL) 50 MG tablet Take 50 mg by mouth at bedtime.     triamterene-hydrochlorothiazide (MAXZIDE-25) 37.5-25 MG tablet Take 1 tablet by mouth daily.  0   No current facility-administered medications on file prior to visit.   Physical Exam  BP 130/84 (BP Location: Left Arm)   Pulse 68   Resp 18   Ht 6' (1.829 m)   Wt 202 lb (91.6 kg)   SpO2 93% Comment: RA  BMI 27.40 kg/m   Gen: NAD Neck: no carotid bruit Heart: RRR Lungs: CTA bilaterally Abd: soft non-tender Ext: no edema Neuro: grossly normal  CTA Results:  FINDINGS: Cardiovascular: No acute vascular findings  are demonstrated. Bovine variant brachiocephalic artery arch anatomy again noted. There is stable mild dilatation of the ascending aorta which has a diameter of 4.3 cm. No evidence of dissection or mediastinal hematoma. AST occluded device again noted. The heart size is normal. There is no significant pericardial fluid.   Mediastinum/Nodes: There are no enlarged mediastinal, hilar or axillary lymph nodes. The thyroid gland, trachea and esophagus demonstrate no significant findings.   Lungs/Pleura: No pleural effusion or pneumothorax. Stable benign 4 mm lingular nodule on image 99/11. No new, enlarging or suspicious pulmonary nodules. No confluent airspace disease.   Upper abdomen: The visualized upper abdomen appears stable, without significant findings.   Musculoskeletal/Chest wall: There is no chest wall mass or suspicious osseous finding. Mild thoracic spondylosis.   Review of the MIP images confirms the above findings.   IMPRESSION: 1. Stable mild dilatation of the ascending aorta measuring 4.3 cm. No acute vascular findings. 2. No acute chest findings.     Electronically Signed   By: Carey Bullocks M.D.   On: 07/12/2023 15:15      A/P:  Ascending Aortic Aneurysm- measuring 4.3 cm- remains clinically stable will continue yearly surveillance HTN-continue Norvasc, Maxzide.Marland Kitchen HLD- on Lipitor  Depression  Risk Modification:  Statin:  Yes  Patient was counseled on importance of Blood Pressure Control.  Despite Medical intervention if the patient notices persistently elevated blood pressure readings.  They are instructed to contact their Primary Care Physician  Please avoid use of Fluoroquinolones as this can potentially increase your risk of Aortic Rupture and/or Dissection  Patient educated on signs and symptoms of Aortic Dissection, handout also provided in AVS  Troy Kings, PA-C 07/13/23

## 2023-07-13 NOTE — Patient Instructions (Signed)
Patient is counseled regarding the importance of long term risk factor modification as they pertain to the presence of ischemic heart disease including avoiding the use of all tobacco products, dietary modifications and medical therapy for diabetes, cholesterol and lipid management, and regular exercise.    AVOID FLOUROQUINOLONES AS THESE CAN INCREASE YOUR RISK OF DISSECTION (Ex: Ciprofloxacin)

## 2023-07-19 ENCOUNTER — Ambulatory Visit: Payer: Medicare PPO | Admitting: Physician Assistant

## 2023-07-19 VITALS — BP 130/84 | HR 68 | Resp 18 | Ht 72.0 in | Wt 202.0 lb

## 2023-07-19 DIAGNOSIS — I7121 Aneurysm of the ascending aorta, without rupture: Secondary | ICD-10-CM | POA: Diagnosis not present

## 2023-07-26 ENCOUNTER — Telehealth: Payer: Medicare PPO | Admitting: Neurology

## 2023-07-28 ENCOUNTER — Telehealth: Payer: Medicare PPO | Admitting: Neurology

## 2023-07-28 DIAGNOSIS — G43709 Chronic migraine without aura, not intractable, without status migrainosus: Secondary | ICD-10-CM

## 2023-07-28 DIAGNOSIS — G44019 Episodic cluster headache, not intractable: Secondary | ICD-10-CM

## 2023-07-28 NOTE — Progress Notes (Addendum)
[facility]    Provider:  Dr Lucia Gaskins Requesting Provider: Darrow Bussing, MD Primary Care Provider:  Darrow Bussing, MD   Virtual Visit via Video Note  I connected with Troy [name] on 07/28/2023 at 11:00 AM EST by a video enabled telemedicine application and verified that I am speaking with the correct person using two identifiers.  Location: Patient: home Provider: work/office   I discussed the limitations of evaluation and management by telemedicine and the availability of in person appointments. The patient expressed understanding and agreed to proceed.  History of Present Illness:   Follow Up Instructions:    I discussed the assessment and treatment plan with the patient. The patient was provided an opportunity to ask questions and all were answered. The patient agreed with the plan and demonstrated an understanding of the instructions.   The patient was advised to call back or seek an in-person evaluation if the symptoms worsen or if the condition fails to improve as anticipated.  I provided 10 minutes of non-face-to-face time during this encounter.   [name], MD   CC:  cluster headaches  07/08/2023: Patient doing very well on emgality. Doing great, no cluster episodes or migraines while on emgality, can try to decrease dose every month and then use 300mg  when in an episode. But will continue once monthly for migraines, used to have > 8 migraine days a month and > 15 total headache days a month continue emgality monthly  Patient complains of symptoms per HPI as well as the following symptoms: none . Pertinent negatives and positives per HPI. All others negative  Medications tried that can be used in migrianes or episodic cluster headache management> 3 months: Norvasc, verapamil, aspirin, Tylenol, Imitrex injectable, Imitrex oral, prednisone injections and high-dose prednisone oral, Valium, Voltaren, lidocaine injection, lorazepam,  methylprednisolone oral, Zofran, oxycodone, Paxil, Phenergan, oxygen, trazodone, topamax, propranolol, amitriptyline, gabapentin.  Lithium contraindicated due to mood disorder that is not bipolar and already on trazodone; lithium has not been proven effective for depression unless in the setting of bipolar and patient has depression.  I would prefer to stay away from lithium since he is being treated with trazodone.  Follow-up 07/27/2022: Patient is here for follow-up for episodic cluster headaches we saw him about a year ago and discussed the plan.  Also past medical history of depression, fibromyalgia, hypertension, PFO involved with stroke, thoracic aortic aneurysm and at that time we discussed acute and chronic treatments.  Doing great, no cluster episodes while on emgality, can try to decrease dose every month and then use 300mg  when in an episode. Continue current regimen. He will call asap if starts a cluster but hopefully the emgality will keep him from having his cluster headache episodes continue.  Patient complains of symptoms per HPI as well as the following symptoms: none . Pertinent negatives and positives per HPI. All others negative   HPI 07/23/2021:  Troy [name] is a 68 y.o. male here as requested by Darrow Bussing, MD for cluster headaches. PMHx cluster headaches, depression, fibromyalgia, hypertension, PFO with atrial septal aneurysm July 15, 2015 involved with stroke, thoracic aortic aneurysm.  I reviewed my prior notes from 2018, he has episodic cluster headaches, saw me years ago in 2018, at that time we discussed acute and chronic management of medications, and he decided not to start verapamil and just use acute, we gave him Imitrex injectable and started steroids.  I reviewed charts and MRI images below.  Today he is back again  for episodic cluster headaches, I reviewed Dr. Docia Chuck  notes: History of cluster headaches, goes back many years, last episode was a few years ago, he  had improved since he had his PFO thought it might of been some relation to it, but for for the last 3 to 4 days he has been getting pain, left-sided, orbital with some tearing no vision issues no nausea vomiting, they prescribed Zembrace but he never picked it up due to prior Auth, he is to use ergot's.  He was given oral Imitrex.   We had an extended visit today, patient thought when he had his PFO fixed maybe the clusters had gone away but he has been in the throes of an episode the last 10 days, I have not seen him since 2018, he returns today with horrible cluster headaches, they usually last about a month, they are severe, stabbing excruciating orbital or periorbital pain, with autonomic symptoms, his father had cluster headaches as well, he has had them for years, we have talked about cluster headaches in the past, foods that may trigger, however he does not seem to see a pattern with foods but he is staying away from alcohol.  Since his episodes come every several years, in the past he has declined verapamil but he got a prescription from his primary care and had side effects.  At this point we talked about what we do.  We gave him oxygen in the past but by the time we get that his episode will almost be over.  We talked about steroids, treatment, he is episodes can last upwards of 3 hours, likely Imitrex tabs help, some brace was too expensive for him even when his primary care had the PA approved, nothing seems to trigger it unknown.  Severe.No other focal neurologic deficits, associated symptoms, inciting events or modifiable factors.     Reviewed notes, labs and imaging from outside physicians, which showed:  MRI brain 2016: FINDINGS: reviewed report Cerebral volume is within normal limits for age. Major intracranial vascular flow voids are preserved. There is a mild degree of intracranial artery dolichoectasia. Subtle focus of abnormal trace diffusion in the right motor strip series 3,  image 38. This is evident on the ADC map (image 38). There is also a small linear right parietal lobe subcortical white matter focus of restricted diffusion (series 3, image 33). Tiny focus also seen in the right caudate nucleus on image 30. There also appears to be a right lateral occipital lobe cortical focus along the posterior margin of the right MCA territory (image 24).   No left hemisphere or posterior fossa restricted diffusion.   No acute intracranial hemorrhage identified. No midline shift, mass effect, or evidence of intracranial mass lesion. Mild if any associated T2 hyperintensity at the abnormal diffusion sites.   Chronic micro hemorrhage in the inferior left cerebellum. There is a small focus of left MCA cortical encephalomalacia on series 7, image 23.   Negative visualized cervical spine. No ventriculomegaly. Normal bone marrow signal. Visible internal auditory structures appear normal. Mastoids are clear. Trace paranasal sinus mucosal thickening. Orbit and scalp soft tissues within normal limits.   IMPRESSION: 1. Scattered small/punctate acute infarcts in the right MCA territory, including the right motor strip near the left upper extremity representation, right caudate. 2. No hemorrhage or mass effect. 3. Small chronic cortical infarct in the left MCA territory. Small chronic micro hemorrhage in the left cerebellum.  Review of Systems: Patient complains of symptoms per HPI  as well as the following symptoms episodic cluster headaches. Pertinent negatives and positives per HPI. All others negative.   Social History   Socioeconomic History   Marital status: Married    Spouse name: Alona Bene   Number of children: 1   Years of education: PhD   Highest education level: Not on file  Occupational History   Occupation: [employer]  Tobacco Use   Smoking status: Never   Smokeless tobacco: Never  Vaping Use   Vaping status: Never Used  Substance and Sexual Activity    Alcohol use: Yes    Alcohol/week: 5.0 standard drinks of alcohol    Types: 5 Cans of beer per week   Drug use: No   Sexual activity: Not Currently  Other Topics Concern   Not on file  Social History Narrative   Lives with wife Williemae Area from [place]. Moved about 5 years ago   Caffeine use: daily   Social Drivers of Health   Financial Resource Strain: Low Risk  (05/26/2023)   Received from [facility]   Overall Financial Resource Strain (CARDIA)    Difficulty of Paying Living Expenses: Not hard at all  Food Insecurity: No Food Insecurity (05/26/2023)   Received from [facility]   Hunger Vital Sign    Worried About Running Out of Food in the Last Year: Never true    Ran Out of Food in the Last Year: Never true  Transportation Needs: No Transportation Needs (05/26/2023)   Received from [facility]   [employer] - Administrator, Civil Service (Medical): No    Lack of Transportation (Non-Medical): No  Physical Activity: Sufficiently Active (05/26/2023)   Received from [facility]   Exercise Vital Sign    Days of Exercise per Week: 4 days    Minutes of Exercise per Session: 60 min  Stress: No Stress Concern Present (05/26/2023)   Received from [facility]   [employer] of Occupational Health - Occupational Stress Questionnaire    Feeling of Stress : Only a little  Social Connections: Moderately Integrated (05/26/2023)   Received from [facility]   Social Connection and Isolation Panel [NHANES]    Frequency of Communication with Friends and Family: Twice a week    Frequency of Social Gatherings with Friends and Family: Twice a week    Attends Religious Services: Never    Database administrator or Organizations: Yes    Attends Banker Meetings: 1 to 4 times per year    Marital Status: Married  Catering manager Violence: Unknown (10/03/2021)   Received from [employer] [facility]   HITS    Physically Hurt: Not on file    Insult or Talk Down To: Not on file    Threaten Physical Harm: Not on file    Scream or Curse: Not on file    Family History  Problem Relation Age of Onset   Headache Father     Past Medical History:  Diagnosis Date   Cluster headaches    "q couple years" (07/15/2015)   Depression    "mood stabilizer"   Fibromyalgia    Hypercholesterolemia    Hypertension    PFO with atrial septal aneurysm 07/15/2015   s/p 35 mm Amplatzer PFO device by Dr. Excell Seltzer 07/15/2015 // Echocardiogram 02/2019: EF 55-60, Gr 1 DD, mod LAE, mild TR, mild to mod aortic sclerosis, ascending aorta 38 mm, PFO closure device present without residual  shunt   Stroke (HCC) 11/2014; 05/2015   denies residual from either on 07/15/2015   Thoracic aortic aneurysm (HCC)    CT 12/19: 4.3 cm    Patient Active Problem List   Diagnosis Date Noted   Strongyloidiasis 11/02/2019   Asthma 09/15/2019   Eosinophilia 08/10/2019   Sarcoidosis 07/12/2019   Chronic cough 07/12/2019   Thoracic aortic aneurysm (HCC)    Mediastinal adenopathy 11/11/2017   Cluster headache 03/11/2016   PFO with atrial septal aneurysm 07/15/2015   Acute CVA (cerebrovascular accident) (HCC) 06/09/2015   TIA (transient ischemic attack) 06/08/2015   Stroke (cerebrum) (HCC) 06/08/2015   Aphasia 06/08/2015   Left arm numbness 06/08/2015   Hypertension     Past Surgical History:  Procedure Laterality Date   CARDIAC CATHETERIZATION N/A 07/15/2015   Procedure: PFO Closure;  Surgeon: Tonny Bollman, MD;  Location: [facility] INVASIVE CV LAB;  Service: Cardiovascular;  Laterality: N/A;   KNEE ARTHROSCOPY Right 2013   "meniscus repair"   MEDIASTINOSCOPY N/A 11/15/2017   Procedure: MEDIASTINOSCOPY;  Surgeon: Loreli Slot, MD;  Location: [facility] OR;  Service: Thoracic;  Laterality: N/A;   PATENT FORAMEN OVALE CLOSURE  07/15/2015   SHOULDER OPEN ROTATOR CUFF REPAIR Right 1990    Current Outpatient  Medications  Medication Sig Dispense Refill   Galcanezumab-gnlm (EMGALITY) 120 MG/ML SOAJ Inject 120 mg into the skin every 30 (thirty) days. 1.12 mL 11   amLODipine (NORVASC) 2.5 MG tablet Take 5 mg by mouth daily.      aspirin EC 81 MG tablet Take 1 tablet (81 mg total) by mouth daily. 30 tablet 0   atorvastatin (LIPITOR) 20 MG tablet Take 20 mg by mouth daily.   7   SUMAtriptan (IMITREX) 100 MG tablet Take 1 tablet (100 mg total) by mouth once as needed for up to 1 dose. May repeat in 2 hours if headache persists or recurs. Max 2x a day. 27 tablet 3   traZODone (DESYREL) 50 MG tablet Take 50 mg by mouth at bedtime.     triamterene-hydrochlorothiazide (MAXZIDE-25) 37.5-25 MG tablet Take 1 tablet by mouth daily.  0   No current facility-administered medications for this visit.    Allergies as of 07/28/2023   (No Known Allergies)    Vitals: There were no vitals taken for this visit. Last Weight:  Wt Readings from Last 1 Encounters:  07/19/23 202 lb (91.6 kg)   Last Height:   Ht Readings from Last 1 Encounters:  07/19/23 6' (1.829 m)     Physical exam: Exam: Gen: NAD, conversant      CV: No palpitations or chest pain or SOB. VS: Breathing at a normal rate. Weight appears within normal limits. Not febrile. Eyes: Conjunctivae clear without exudates or hemorrhage  Neuro: Detailed Neurologic Exam  Speech:    Speech is normal; fluent and spontaneous with normal comprehension.  Cognition:    The patient is oriented to person, place, and time;     recent and remote memory intact;     language fluent;     normal attention, concentration, fund of knowledge Cranial Nerves:    The pupils are equal, round, and reactive to light. Visual fields are full Extraocular movements are intact.  The face is symmetric with normal sensation. The palate elevates in the midline. Hearing intact. Voice is normal. Shoulder shrug is normal. The tongue has normal motion without fasciculations.    Coordination: normal  Gait:    No abnormalities noted or reported  Motor  Observation:   no involuntary movements noted. Tone:    Appears normal  Posture:    Posture is normal. normal erect    Strength:    Strength is anti-gravity and symmetric in the upper and lower limbs.      Sensation: intact to LT, no reports of numbness or tingling or paresthesias         Assessment/Plan:   68 y.o. male here as requested by Darrow Bussing, MD for episodic cluster headaches and migraines. PMHx cluster headaches, depression, fibromyalgia, hypertension, PFO with atrial septal aneurysm July 15, 2015 involved with stroke, thoracic aortic aneurysm.  I reviewed my prior notes from 2018, he has episodic cluster headaches, saw me years ago in 2018, at that time we discussed acute and chronic management of medications, and he decided not to start verapamil and just use acute, we gave him Imitrex injectable and started steroids.patient thought when he had his PFO fixed maybe the clusters had gone away but he has been in the throes of an episode the last 10 days, he returns today with horrible cluster headaches.  -Doing great, no cluster episodes or migraines while on emgality, can try to decrease dose every month and then use 300mg  when in an episode. But will continue once monthly for migraines, used to have > 8 migraine days a month and > 15 total headache days a month continue emgality monthly  Medications tried that can be used in migrianes or episodic cluster headache management> 3 months: Norvasc, verapamil, aspirin, Tylenol, Imitrex injectable, Imitrex oral, prednisone injections and high-dose prednisone oral, Valium, Voltaren, lidocaine injection, lorazepam, methylprednisolone oral, Zofran, oxycodone, Paxil, Phenergan, oxygen, trazodone, topamax, propranolol, amitriptyline, gabapentin.  Lithium contraindicated due to mood disorder that is not bipolar and already on trazodone; lithium has not been  proven effective for depression unless in the setting of bipolar and patient has depression.  I would prefer to stay away from lithium since he is being treated with trazodone.  - we have talked about cluster headaches again today, foods that may trigger, however he does not seem to see a pattern with foods but he is staying away from alcohol.  Since his episodes come every several years, in the past he has declined verapamil but he got a prescription from his primary care and had side effects.     - We gave him oxygen in the past but by the time we get that his episode will almost be over.  Imitrex tabs help.  He has tried Imitrex injectable as well in the past.  Side effects to verapamil.  In cases of acute cluster episode often times we have to start with extremely high dose steroids 100 mg and titrate over 2 weeks to get patient immediate relief.  Luckily we do not have to do that today, Imitrex tabs help him.  Usually we do not recommend taking them more than 9 times a month but I did explain the risks of stroke and agreed to give him enough for twice a day since his clusters may only last a month.    - continue current regimen  - refill meds, discussed copay card to try for emgality  Meds ordered this encounter  Medications   Galcanezumab-gnlm (EMGALITY) 120 MG/ML SOAJ    Sig: Inject 120 mg into the skin every 30 (thirty) days.    Dispense:  1.12 mL    Refill:  11    Cc: Koirala, Dibas, MD,  Koirala, Dibas, MD  Naomie Dean,  MD  [facility] [address] [place], [place] [address]  Phone [phone] Fax [phone]

## 2023-07-29 ENCOUNTER — Telehealth: Payer: Self-pay | Admitting: Neurology

## 2023-07-29 ENCOUNTER — Encounter: Payer: Self-pay | Admitting: Neurology

## 2023-07-29 MED ORDER — EMGALITY 120 MG/ML ~~LOC~~ SOAJ: 120.0000 mg | SUBCUTANEOUS | 11 refills | Status: DC

## 2023-07-29 NOTE — Telephone Encounter (Signed)
Can we schedule a 43-month follow up video with me please? Thanks

## 2023-07-29 NOTE — Addendum Note (Signed)
Addended by: Naomie Dean B on: 07/29/2023 05:29 PM   Modules accepted: Level of Service

## 2023-08-05 ENCOUNTER — Other Ambulatory Visit: Payer: Self-pay

## 2023-08-05 DIAGNOSIS — G43709 Chronic migraine without aura, not intractable, without status migrainosus: Secondary | ICD-10-CM

## 2023-08-05 MED ORDER — EMGALITY 120 MG/ML ~~LOC~~ SOAJ: 120.0000 mg | SUBCUTANEOUS | 11 refills | Status: DC

## 2023-08-17 DIAGNOSIS — L821 Other seborrheic keratosis: Secondary | ICD-10-CM | POA: Diagnosis not present

## 2023-08-17 DIAGNOSIS — D485 Neoplasm of uncertain behavior of skin: Secondary | ICD-10-CM | POA: Diagnosis not present

## 2023-08-17 DIAGNOSIS — D044 Carcinoma in situ of skin of scalp and neck: Secondary | ICD-10-CM | POA: Diagnosis not present

## 2023-08-17 DIAGNOSIS — L814 Other melanin hyperpigmentation: Secondary | ICD-10-CM | POA: Diagnosis not present

## 2023-08-19 DIAGNOSIS — H524 Presbyopia: Secondary | ICD-10-CM | POA: Diagnosis not present

## 2023-08-19 DIAGNOSIS — Z961 Presence of intraocular lens: Secondary | ICD-10-CM | POA: Diagnosis not present

## 2023-08-19 DIAGNOSIS — H52223 Regular astigmatism, bilateral: Secondary | ICD-10-CM | POA: Diagnosis not present

## 2023-08-19 DIAGNOSIS — H40013 Open angle with borderline findings, low risk, bilateral: Secondary | ICD-10-CM | POA: Diagnosis not present

## 2023-08-19 DIAGNOSIS — H5203 Hypermetropia, bilateral: Secondary | ICD-10-CM | POA: Diagnosis not present

## 2023-08-24 DIAGNOSIS — D044 Carcinoma in situ of skin of scalp and neck: Secondary | ICD-10-CM | POA: Diagnosis not present

## 2023-09-15 DIAGNOSIS — Z23 Encounter for immunization: Secondary | ICD-10-CM | POA: Diagnosis not present

## 2023-09-15 DIAGNOSIS — F5101 Primary insomnia: Secondary | ICD-10-CM | POA: Diagnosis not present

## 2023-09-15 DIAGNOSIS — R002 Palpitations: Secondary | ICD-10-CM | POA: Diagnosis not present

## 2023-09-15 DIAGNOSIS — E7800 Pure hypercholesterolemia, unspecified: Secondary | ICD-10-CM | POA: Diagnosis not present

## 2023-09-15 DIAGNOSIS — Z79899 Other long term (current) drug therapy: Secondary | ICD-10-CM | POA: Diagnosis not present

## 2023-09-15 DIAGNOSIS — Z0001 Encounter for general adult medical examination with abnormal findings: Secondary | ICD-10-CM | POA: Diagnosis not present

## 2023-09-15 DIAGNOSIS — I10 Essential (primary) hypertension: Secondary | ICD-10-CM | POA: Diagnosis not present

## 2023-09-15 DIAGNOSIS — I77810 Thoracic aortic ectasia: Secondary | ICD-10-CM | POA: Diagnosis not present

## 2023-11-03 DIAGNOSIS — M1812 Unilateral primary osteoarthritis of first carpometacarpal joint, left hand: Secondary | ICD-10-CM | POA: Diagnosis not present

## 2023-11-03 DIAGNOSIS — M19042 Primary osteoarthritis, left hand: Secondary | ICD-10-CM | POA: Diagnosis not present

## 2023-12-20 DIAGNOSIS — H40013 Open angle with borderline findings, low risk, bilateral: Secondary | ICD-10-CM | POA: Diagnosis not present

## 2024-01-26 ENCOUNTER — Telehealth: Payer: Medicare PPO | Admitting: Neurology

## 2024-01-26 DIAGNOSIS — G44019 Episodic cluster headache, not intractable: Secondary | ICD-10-CM | POA: Diagnosis not present

## 2024-01-26 NOTE — Progress Notes (Unsigned)
 [facility]    Provider:  Dr [name] Requesting Provider: Regino Slater, MD Primary Care Provider:  Regino Slater, MD   Virtual Visit via Video Note  I connected with Troy [name] on 01/26/2024 at 11:30 AM EDT by a video enabled telemedicine application and verified that I am speaking with the correct person using two identifiers.  Location: Patient: home Provider: work/office   I discussed the limitations of evaluation and management by telemedicine and the availability of in person appointments. The patient expressed understanding and agreed to proceed.   Follow Up Instructions:    I discussed the assessment and treatment plan with the patient. The patient was provided an opportunity to ask questions and all were answered. The patient agreed with the plan and demonstrated an understanding of the instructions.   The patient was advised to call back or seek an in-person evaluation if the symptoms worsen or if the condition fails to improve as anticipated.  I provided 30 minutes of non-face-to-face time during this encounter.   Troy KATHEE Ines, MD   CC:  cluster headaches  01/26/2024: He is injecting the 120mg  a month pen and doing well. No episodes since 2023.  He has been doing well on the Emgality  for migraines and cluster headaches, however he really has not had any migraines or any cluster headaches, and he has not had a cluster episode since 2023.  The Emgality  is costing him $100 a month.  We discussed at this time he can probably go off the Emgality , he gets 120 mg a month and he prefers the pen to the injections which he was getting when he had the cluster headache at 300 mg.  We discussed in the future what would happen, we discussed oxygen therapy, the quickest way to address the cluster headaches is to inject Imitrex  and then start steroids that day with a tapering schedule, the steroids have to be high-dose over 2 weeks but that is usually quite  sufficient to to get you relief in addition the Emgality  injection when you start the cluster episode.  When you do have an episode can inject 1-3 of them that month and also when having an episode high-dose steroids. Tried zembrace in the past will order generic Imitrex  injectable. Will not order oxyge therapy.   07/08/2023: Patient doing very well on emgality . Doing great, no cluster episodes or migraines while on emgality , can try to decrease dose every month and then use 300mg  when in an episode. But will continue once monthly for migraines, used to have > 8 migraine days a month and > 15 total headache days a month continue emgality  monthly  Patient complains of symptoms per HPI as well as the following symptoms: none . Pertinent negatives and positives per HPI. All others negative  Medications tried that can be used in migrianes or episodic cluster headache management> 3 months: Norvasc, verapamil, aspirin , Tylenol , Imitrex  injectable, Imitrex  oral, prednisone  injections and high-dose prednisone  oral, Valium , Voltaren , lidocaine  injection, lorazepam , methylprednisolone  oral, Zofran , oxycodone , Paxil , Phenergan , oxygen, trazodone, topamax, propranolol, amitriptyline, gabapentin.  Lithium contraindicated due to mood disorder that is not bipolar and already on trazodone; lithium has not been proven effective for depression unless in the setting of bipolar and patient has depression.  I would prefer to stay away from lithium since he is being treated with trazodone. Oral and nasal imitrex .   Follow-up 07/27/2022: Patient is here for follow-up for episodic cluster headaches we saw him about a year ago and discussed  the plan.  Also past medical history of depression, fibromyalgia, hypertension, PFO involved with stroke, thoracic aortic aneurysm and at that time we discussed acute and chronic treatments.  Doing great, no cluster episodes while on emgality , can try to decrease dose every month and then use  300mg  when in an episode. Continue current regimen. He will call asap if starts a cluster but hopefully the emgality  will keep him from having his cluster headache episodes continue.  Patient complains of symptoms per HPI as well as the following symptoms: none . Pertinent negatives and positives per HPI. All others negative   HPI 07/23/2021:  Troy [name] is a 68 y.o. male here as requested by Regino Slater, MD for cluster headaches. PMHx cluster headaches, depression, fibromyalgia, hypertension, PFO with atrial septal aneurysm July 15, 2015 involved with stroke, thoracic aortic aneurysm.  I reviewed my prior notes from 2018, he has episodic cluster headaches, saw me years ago in 2018, at that time we discussed acute and chronic management of medications, and he decided not to start verapamil and just use acute, we gave him Imitrex  injectable and started steroids.  I reviewed charts and MRI images below.  Today he is back again for episodic cluster headaches, I reviewed Dr. Regino  notes: History of cluster headaches, goes back many years, last episode was a few years ago, he had improved since he had his PFO thought it might of been some relation to it, but for for the last 3 to 4 days he has been getting pain, left-sided, orbital with some tearing no vision issues no nausea vomiting, they prescribed Zembrace but he never picked it up due to prior Auth, he is to use ergot's.  He was given oral Imitrex .   We had an extended visit today, patient thought when he had his PFO fixed maybe the clusters had gone away but he has been in the throes of an episode the last 10 days, I have not seen him since 2018, he returns today with horrible cluster headaches, they usually last about a month, they are severe, stabbing excruciating orbital or periorbital pain, with autonomic symptoms, his father had cluster headaches as well, he has had them for years, we have talked about cluster headaches in the past, foods  that may trigger, however he does not seem to see a pattern with foods but he is staying away from alcohol.  Since his episodes come every several years, in the past he has declined verapamil but he got a prescription from his primary care and had side effects.  At this point we talked about what we do.  We gave him oxygen in the past but by the time we get that his episode will almost be over.  We talked about steroids, treatment, he is episodes can last upwards of 3 hours, likely Imitrex  tabs help, some brace was too expensive for him even when his primary care had the PA approved, nothing seems to trigger it unknown.  Severe.No other focal neurologic deficits, associated symptoms, inciting events or modifiable factors.     Reviewed notes, labs and imaging from outside physicians, which showed:  MRI brain 2016: FINDINGS: reviewed report Cerebral volume is within normal limits for age. Major intracranial vascular flow voids are preserved. There is a mild degree of intracranial artery dolichoectasia. Subtle focus of abnormal trace diffusion in the right motor strip series 3, image 38. This is evident on the ADC map (image 38). There is also a small linear right  parietal lobe subcortical white matter focus of restricted diffusion (series 3, image 33). Tiny focus also seen in the right caudate nucleus on image 30. There also appears to be a right lateral occipital lobe cortical focus along the posterior margin of the right MCA territory (image 24).   No left hemisphere or posterior fossa restricted diffusion.   No acute intracranial hemorrhage identified. No midline shift, mass effect, or evidence of intracranial mass lesion. Mild if any associated T2 hyperintensity at the abnormal diffusion sites.   Chronic micro hemorrhage in the inferior left cerebellum. There is a small focus of left MCA cortical encephalomalacia on series 7, image 23.   Negative visualized cervical spine. No  ventriculomegaly. Normal bone marrow signal. Visible internal auditory structures appear normal. Mastoids are clear. Trace paranasal sinus mucosal thickening. Orbit and scalp soft tissues within normal limits.   IMPRESSION: 1. Scattered small/punctate acute infarcts in the right MCA territory, including the right motor strip near the left upper extremity representation, right caudate. 2. No hemorrhage or mass effect. 3. Small chronic cortical infarct in the left MCA territory. Small chronic micro hemorrhage in the left cerebellum.  Review of Systems: Patient complains of symptoms per HPI as well as the following symptoms episodic cluster headaches. Pertinent negatives and positives per HPI. All others negative.   Social History   Socioeconomic History   Marital status: Married    Spouse name: Merlynn   Number of children: 1   Years of education: PhD   Highest education level: Not on file  Occupational History   Occupation: [employer]  Tobacco Use   Smoking status: Never   Smokeless tobacco: Never  Vaping Use   Vaping status: Never Used  Substance and Sexual Activity   Alcohol use: Yes    Alcohol/week: 5.0 standard drinks of alcohol    Types: 5 Cans of beer per week   Drug use: No   Sexual activity: Not Currently  Other Topics Concern   Not on file  Social History Narrative   Lives with wife Merlynn Salmons from [place] . Moved about 5 years ago   Caffeine use: daily   Social Drivers of Health   Financial Resource Strain: Low Risk  (05/26/2023)   Received from [facility]   Overall Financial Resource Strain (CARDIA)    Difficulty of Paying Living Expenses: Not hard at all  Food Insecurity: No Food Insecurity (05/26/2023)   Received from [facility]   Hunger Vital Sign    Within the past 12 months, you worried that your food would run out before you got the money to buy more.: Never true    Within the past 12 months, the food you bought just didn't  last and you didn't have money to get more.: Never true  Transportation Needs: No Transportation Needs (05/26/2023)   Received from [facility]   [employer] - Administrator, Civil Service (Medical): No    Lack of Transportation (Non-Medical): No  Physical Activity: Sufficiently Active (05/26/2023)   Received from [facility]   Exercise Vital Sign    On average, how many days per week do you engage in moderate to strenuous exercise (like a brisk walk)?: 4 days    On average, how many minutes do you engage in exercise at this level?: 60 min  Stress: No Stress Concern Present (05/26/2023)   Received from [facility]   [employer] of Occupational Health - Occupational Stress Questionnaire  Feeling of Stress : Only a little  Social Connections: Moderately Integrated (05/26/2023)   Received from [facility]   Social Connection and Isolation Panel    In a typical week, how many times do you talk on the phone with family, friends, or neighbors?: Twice a week    How often do you get together with friends or relatives?: Twice a week    How often do you attend church or religious services?: Never    Do you belong to any clubs or organizations such as church groups, unions, fraternal or athletic groups, or school groups?: Yes    How often do you attend meetings of the clubs or organizations you belong to?: 1 to 4 times per year    Are you married, widowed, divorced, separated, never married, or living with a partner?: Married  Intimate Partner Violence: Unknown (10/03/2021)   Received from [facility]   HITS    Physically Hurt: Not on file    Insult or Talk Down To: Not on file    Threaten Physical Harm: Not on file    Scream or Curse: Not on file    Family History  Problem Relation Age of Onset   Headache Father     Past Medical History:  Diagnosis Date   Cluster headaches    q couple years (07/15/2015)   Depression     mood stabilizer   Fibromyalgia    Hypercholesterolemia    Hypertension    PFO with atrial septal aneurysm 07/15/2015   s/p 35 mm Amplatzer PFO device by Dr. Wonda 07/15/2015 // Echocardiogram 02/2019: EF 55-60, Gr 1 DD, mod LAE, mild TR, mild to mod aortic sclerosis, ascending aorta 38 mm, PFO closure device present without residual shunt   Stroke (HCC) 11/2014; 05/2015   denies residual from either on 07/15/2015   Thoracic aortic aneurysm (HCC)    CT 12/19: 4.3 cm    Patient Active Problem List   Diagnosis Date Noted   Strongyloidiasis 11/02/2019   Asthma 09/15/2019   Eosinophilia 08/10/2019   Sarcoidosis 07/12/2019   Chronic cough 07/12/2019   Thoracic aortic aneurysm (HCC)    Mediastinal adenopathy 11/11/2017   Cluster headache 03/11/2016   PFO with atrial septal aneurysm 07/15/2015   Acute CVA (cerebrovascular accident) (HCC) 06/09/2015   TIA (transient ischemic attack) 06/08/2015   Stroke (cerebrum) (HCC) 06/08/2015   Aphasia 06/08/2015   Left arm numbness 06/08/2015   Hypertension     Past Surgical History:  Procedure Laterality Date   CARDIAC CATHETERIZATION N/A 07/15/2015   Procedure: PFO Closure;  Surgeon: Ozell Wonda, MD;  Location: [facility] INVASIVE CV LAB;  Service: Cardiovascular;  Laterality: N/A;   KNEE ARTHROSCOPY Right 2013   meniscus repair   MEDIASTINOSCOPY N/A 11/15/2017   Procedure: MEDIASTINOSCOPY;  Surgeon: Kerrin Elspeth BROCKS, MD;  Location: [facility] OR;  Service: Thoracic;  Laterality: N/A;   PATENT FORAMEN OVALE CLOSURE  07/15/2015   SHOULDER OPEN ROTATOR CUFF REPAIR Right 1990    Current Outpatient Medications  Medication Sig Dispense Refill   predniSONE  (DELTASONE ) 20 MG tablet At onset of cluster headace: Take 100 mg (5 tablets) for 2 days, then take 80 mg (4 tablets) for 2 days, then take 60 mg (3 tablets) for 2 days, and take 40 mg (2 tablets) for 2 days then take 20 mg (1 tablet) for 2 days, then take 10 mg (half tablet) for 2 days then stop.   Take pills in the morning with food. 31 tablet  1   SUMAtriptan  (IMITREX ) 6 MG/0.5ML SOLN injection Inject 0.5 mLs (6 mg total) into the skin every 2 (two) hours as needed for migraine or headache. Take one dose at headache onset, can take additional dose 2hrs later if needed. No more then 2 injections in 24hrs 6 mL 6   amLODipine (NORVASC) 2.5 MG tablet Take 5 mg by mouth daily.      aspirin  EC 81 MG tablet Take 1 tablet (81 mg total) by mouth daily. 30 tablet 0   atorvastatin  (LIPITOR) 20 MG tablet Take 20 mg by mouth daily.   7   EMGALITY  120 MG/ML SOAJ INJECT 120 MG INTO THE SKIN EVERY 30 (THIRTY) DAYS. 3 mL 3   SUMAtriptan  (IMITREX ) 100 MG tablet Take 1 tablet (100 mg total) by mouth once as needed for up to 1 dose. May repeat in 2 hours if headache persists or recurs. Max 2x a day. 27 tablet 3   traZODone (DESYREL) 50 MG tablet Take 50 mg by mouth at bedtime.     triamterene -hydrochlorothiazide  (MAXZIDE -25) 37.5-25 MG tablet Take 1 tablet by mouth daily.  0   No current facility-administered medications for this visit.    Allergies as of 01/26/2024   (No Known Allergies)    Vitals: There were no vitals taken for this visit. Last Weight:  Wt Readings from Last 1 Encounters:  07/19/23 202 lb (91.6 kg)   Last Height:   Ht Readings from Last 1 Encounters:  07/19/23 6' (1.829 m)    Physical exam: Exam: Gen: NAD, conversant      CV: No palpitations or chest pain or SOB. VS: Breathing at a normal rate. Weight appears within normal limits. Not febrile. Eyes: Conjunctivae clear without exudates or hemorrhage  Neuro: Detailed Neurologic Exam  Speech:    Speech is normal; fluent and spontaneous with normal comprehension.  Cognition:    The patient is oriented to person, place, and time;     recent and remote memory intact;     language fluent;     normal attention, concentration, fund of knowledge Cranial Nerves:    The pupils are equal, round, and reactive to light. Visual  fields are full Extraocular movements are intact.  The face is symmetric with normal sensation. The palate elevates in the midline. Hearing intact. Voice is normal. Shoulder shrug is normal. The tongue has normal motion without fasciculations.   Coordination: normal  Gait:    No abnormalities noted or reported  Motor Observation:   no involuntary movements noted. Tone:    Appears normal  Posture:    Posture is normal. normal erect    Strength:    Strength is anti-gravity and symmetric in the upper and lower limbs.      Sensation: intact to LT, no reports of numbness or tingling or paresthesias          Assessment/Plan:   68 y.o. male here as requested by Regino Slater, MD for episodic cluster headaches and migraines. PMHx cluster headaches, depression, fibromyalgia, hypertension, PFO with atrial septal aneurysm July 15, 2015 involved with stroke, thoracic aortic aneurysm.  I reviewed my prior notes from he past, he has episodic cluster headaches, saw me years ago for the first time in 2018, at that time we discussed acute and chronic management of medications, and he decided not to start verapamil and just use acute, we gave him Imitrex  injectable and started steroids.patient thought when he had his PFO fixed maybe the clusters had gone away  but he has a recurrence in 2023. .  -Doing great, no cluster episodes or migraines while on emgality , can try to decrease dose every month and then use when in an episode.   Medications tried that can be used in migrianes or episodic cluster headache management> 3 months: Norvasc, verapamil, aspirin , Tylenol , Imitrex  injectable, Imitrex  oral, prednisone  injections and high-dose prednisone  oral, Valium , Voltaren , lidocaine  injection, lorazepam , methylprednisolone  oral, Zofran , oxycodone , Paxil , Phenergan , oxygen, trazodone, topamax, propranolol, amitriptyline, gabapentin.  Lithium contraindicated due to mood disorder that is not bipolar and  already on trazodone; lithium has not been proven effective for depression unless in the setting of bipolar and patient has depression.  I would prefer to stay away from lithium since he is being treated with trazodone.imitrec PO and nasal and injection  - we have talked about cluster headaches again today, foods that may trigger, however he does not seem to see a pattern with foods but he is staying away from alcohol.  Since his episodes come every several years, in the past he has declined verapamil but he got a prescription from his primary care and had side effects.     - We gave him oxygen in the past but by the time we get that his episode will almost be over.   He has tried Imitrex  injectable as well in the past.  Side effects to verapamil.  In cases of acute cluster episode often times we have to start with extremely high dose steroids 100 mg and titrate over 2 weeks to get patient immediate relief.    PLAN: AT Onset of cluster headaches can inject emgality , start a high-dose taper of steroids and use imitrex  subq  Meds ordered this encounter  Medications   predniSONE  (DELTASONE ) 20 MG tablet    Sig: At onset of cluster headace: Take 100 mg (5 tablets) for 2 days, then take 80 mg (4 tablets) for 2 days, then take 60 mg (3 tablets) for 2 days, and take 40 mg (2 tablets) for 2 days then take 20 mg (1 tablet) for 2 days, then take 10 mg (half tablet) for 2 days then stop.  Take pills in the morning with food.    Dispense:  31 tablet    Refill:  1   SUMAtriptan  (IMITREX ) 6 MG/0.5ML SOLN injection    Sig: Inject 0.5 mLs (6 mg total) into the skin every 2 (two) hours as needed for migraine or headache. Take one dose at headache onset, can take additional dose 2hrs later if needed. No more then 2 injections in 24hrs    Dispense:  6 mL    Refill:  6    Please give him self injector/SOAJ if possible     Cc: Koirala, Dibas, MD,  Koirala, Dibas, MD  Troy Epp, MD  [facility] [address] [place], [place] [address]  Phone [phone] Fax [phone]

## 2024-01-26 NOTE — Patient Instructions (Signed)
 Titrate down on the emgality  and when you start having episodes you can take 1-3 injections a month Immediately use imitrex  injections and also start high dose steroids

## 2024-01-27 ENCOUNTER — Other Ambulatory Visit: Payer: Self-pay | Admitting: Neurology

## 2024-01-27 ENCOUNTER — Encounter: Payer: Self-pay | Admitting: Neurology

## 2024-01-27 DIAGNOSIS — G43709 Chronic migraine without aura, not intractable, without status migrainosus: Secondary | ICD-10-CM

## 2024-01-27 MED ORDER — PREDNISONE 20 MG PO TABS: ORAL_TABLET | ORAL | 1 refills | Status: AC

## 2024-01-27 MED ORDER — SUMATRIPTAN SUCCINATE 6 MG/0.5ML ~~LOC~~ SOLN: 6.0000 mg | SUBCUTANEOUS | 6 refills | Status: AC | PRN

## 2024-04-05 DIAGNOSIS — H40013 Open angle with borderline findings, low risk, bilateral: Secondary | ICD-10-CM | POA: Diagnosis not present

## 2024-05-23 DIAGNOSIS — E785 Hyperlipidemia, unspecified: Secondary | ICD-10-CM | POA: Diagnosis not present

## 2024-05-23 DIAGNOSIS — Z0000 Encounter for general adult medical examination without abnormal findings: Secondary | ICD-10-CM | POA: Diagnosis not present

## 2024-05-23 DIAGNOSIS — I10 Essential (primary) hypertension: Secondary | ICD-10-CM | POA: Diagnosis not present

## 2024-06-08 ENCOUNTER — Other Ambulatory Visit: Payer: Self-pay | Admitting: Thoracic Surgery (Cardiothoracic Vascular Surgery)

## 2024-06-08 DIAGNOSIS — I7121 Aneurysm of the ascending aorta, without rupture: Secondary | ICD-10-CM

## 2024-07-11 ENCOUNTER — Encounter (HOSPITAL_COMMUNITY): Payer: Self-pay

## 2024-07-11 ENCOUNTER — Ambulatory Visit (HOSPITAL_COMMUNITY)

## 2024-07-13 ENCOUNTER — Ambulatory Visit (HOSPITAL_COMMUNITY)
Admission: RE | Admit: 2024-07-13 | Discharge: 2024-07-13 | Disposition: A | Discharge status: Home / Self Care | Source: Ambulatory Visit | Attending: Thoracic Surgery (Cardiothoracic Vascular Surgery) | Admitting: Thoracic Surgery (Cardiothoracic Vascular Surgery)

## 2024-07-13 DIAGNOSIS — I7121 Aneurysm of the ascending aorta, without rupture: Secondary | ICD-10-CM | POA: Insufficient documentation

## 2024-07-13 MED ORDER — IOHEXOL 350 MG/ML SOLN
100.0000 mL | Freq: Once | INTRAVENOUS | Status: AC | PRN
  Administered 2024-07-13: 100 mL via INTRAVENOUS

## 2024-07-25 ENCOUNTER — Ambulatory Visit

## 2024-08-01 ENCOUNTER — Ambulatory Visit

## 2024-08-01 VITALS — BP 122/80 | HR 64 | Resp 20 | Ht 72.0 in | Wt 204.6 lb

## 2024-08-01 DIAGNOSIS — I7121 Aneurysm of the ascending aorta, without rupture: Secondary | ICD-10-CM

## 2024-08-01 NOTE — Patient Instructions (Signed)
